# Patient Record
Sex: Female | Born: 1950 | Race: White | Hispanic: No | Marital: Married | Smoking: Never smoker
Health system: Southern US, Community
[De-identification: ages and names within clinical notes are randomized; demographics above are authoritative.]

## PROBLEM LIST (undated history)

## (undated) ENCOUNTER — Ambulatory Visit (HOSPITAL_COMMUNITY): Disposition: A | Discharge status: Home / Self Care | Payer: BLUE CROSS/BLUE SHIELD

## (undated) DIAGNOSIS — R002 Palpitations: Secondary | ICD-10-CM

## (undated) DIAGNOSIS — A6920 Lyme disease, unspecified: Secondary | ICD-10-CM

## (undated) DIAGNOSIS — L659 Nonscarring hair loss, unspecified: Secondary | ICD-10-CM

## (undated) HISTORY — DX: Nonscarring hair loss, unspecified: L65.9

## (undated) HISTORY — DX: Lyme disease, unspecified: A69.20

## (undated) HISTORY — DX: Palpitations: R00.2

---
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
## 2003-11-22 ENCOUNTER — Emergency Department (HOSPITAL_COMMUNITY): Admission: EM | Admit: 2003-11-22 | Discharge: 2003-11-22 | Payer: Self-pay | Admitting: Emergency Medicine

## 2009-01-06 ENCOUNTER — Ambulatory Visit: Payer: Self-pay | Admitting: Gastroenterology

## 2009-01-06 DIAGNOSIS — K3189 Other diseases of stomach and duodenum: Secondary | ICD-10-CM | POA: Insufficient documentation

## 2009-01-06 DIAGNOSIS — R1013 Epigastric pain: Secondary | ICD-10-CM

## 2011-07-25 ENCOUNTER — Emergency Department (HOSPITAL_COMMUNITY)
Admission: EM | Admit: 2011-07-25 | Discharge: 2011-07-25 | Disposition: A | Discharge status: Home / Self Care | Payer: Self-pay | Attending: Emergency Medicine | Admitting: Emergency Medicine

## 2011-07-25 ENCOUNTER — Encounter (HOSPITAL_COMMUNITY): Payer: Self-pay | Admitting: Emergency Medicine

## 2011-07-25 DIAGNOSIS — R079 Chest pain, unspecified: Secondary | ICD-10-CM | POA: Insufficient documentation

## 2011-07-25 DIAGNOSIS — Z7982 Long term (current) use of aspirin: Secondary | ICD-10-CM | POA: Insufficient documentation

## 2011-07-25 LAB — CBC
HCT: 33.8 % — ABNORMAL LOW (ref 36.0–46.0)
Hemoglobin: 11.9 g/dL — ABNORMAL LOW (ref 12.0–15.0)
MCH: 30.8 pg (ref 26.0–34.0)
MCHC: 35.2 g/dL (ref 30.0–36.0)
MCV: 87.6 fL (ref 78.0–100.0)
Platelets: 185 10*3/uL (ref 150–400)
RBC: 3.86 MIL/uL — ABNORMAL LOW (ref 3.87–5.11)
RDW: 11.6 % (ref 11.5–15.5)
WBC: 4.1 10*3/uL (ref 4.0–10.5)

## 2011-07-25 LAB — DIFFERENTIAL
Basophils Absolute: 0.1 10*3/uL (ref 0.0–0.1)
Basophils Relative: 1 % (ref 0–1)
Eosinophils Absolute: 0.1 10*3/uL (ref 0.0–0.7)
Eosinophils Relative: 2 % (ref 0–5)
Lymphocytes Relative: 23 % (ref 12–46)
Lymphs Abs: 1 10*3/uL (ref 0.7–4.0)
Monocytes Absolute: 0.4 10*3/uL (ref 0.1–1.0)
Monocytes Relative: 10 % (ref 3–12)
Neutro Abs: 2.6 10*3/uL (ref 1.7–7.7)
Neutrophils Relative %: 64 % (ref 43–77)

## 2011-07-25 LAB — COMPREHENSIVE METABOLIC PANEL
ALT: 14 U/L (ref 0–35)
AST: 17 U/L (ref 0–37)
Albumin: 3.8 g/dL (ref 3.5–5.2)
Alkaline Phosphatase: 55 U/L (ref 39–117)
BUN: 12 mg/dL (ref 6–23)
CO2: 25 mEq/L (ref 19–32)
Calcium: 9.1 mg/dL (ref 8.4–10.5)
Chloride: 103 mEq/L (ref 96–112)
Creatinine, Ser: 0.69 mg/dL (ref 0.50–1.10)
GFR calc Af Amer: >90 mL/min (ref >90)
GFR calc non Af Amer: >90 mL/min (ref >90)
Glucose, Bld: 95 mg/dL (ref 70–99)
Potassium: 4.1 mEq/L (ref 3.5–5.1)
Sodium: 136 mEq/L (ref 135–145)
Total Bilirubin: 0.6 mg/dL (ref 0.3–1.2)
Total Protein: 6.5 g/dL (ref 6.0–8.3)

## 2011-07-25 LAB — TROPONIN I: Troponin I: <0.3 ng/mL (ref <0.30)

## 2011-07-25 NOTE — ED Provider Notes (Signed)
History     CSN: [id number]  Arrival date & time 07/25/11  8413   First MD Initiated Contact with Patient 07/25/11 [phone]      Chief Complaint  Patient presents with  . Chest Pain    (Consider location/radiation/quality/duration/timing/severity/associated sxs/prior treatment) Patient is a 61 y.o. female presenting with chest pain. The history is provided by the patient (pt complains of chest pain today with pain in left arm.  pt states she is under alot of stress). No language interpreter was used.  Chest Pain The chest pain began 6 - 12 hours ago. Chest pain occurs frequently. The chest pain is unchanged. The pain is associated with stress. At its most intense, the pain is at 5/10. The pain is currently at 0/10. The severity of the pain is moderate. The quality of the pain is described as aching. The pain radiates to the left arm. Chest pain is worsened by stress. Pertinent negatives for primary symptoms include no fever, no fatigue, no cough and no abdominal pain.  Pertinent negatives for associated symptoms include no claudication.  Pertinent negatives for past medical history include no seizures.     History reviewed. No pertinent past medical history.  History reviewed. No pertinent past surgical history.  History reviewed. No pertinent family history.  History  Substance Use Topics  . Smoking status: Never Smoker   . Smokeless tobacco: Not on file  . Alcohol Use: No    OB History    Grav Para Term Preterm Abortions TAB SAB Ect Mult Living                  Review of Systems  Constitutional: Negative for fever and fatigue.  HENT: Negative for congestion, sinus pressure and ear discharge.   Eyes: Negative for discharge.  Respiratory: Negative for cough.   Cardiovascular: Positive for chest pain. Negative for claudication.  Gastrointestinal: Negative for abdominal pain and diarrhea.  Genitourinary: Negative for frequency and hematuria.  Musculoskeletal: Negative for  back pain.  Skin: Negative for rash.  Neurological: Negative for seizures and headaches.  Hematological: Negative.   Psychiatric/Behavioral: Negative for hallucinations.    Allergies  Review of patient's allergies indicates no known allergies.  Home Medications   Current Outpatient Rx  Name Route Sig Dispense Refill  . ASPIRIN 325 MG PO TABS Oral Take 325 mg by mouth daily.      BP 132/58  Pulse 76  Temp(Src) 98.4 F (36.9 C) (Oral)  Resp 16  SpO2 100%  Physical Exam  Constitutional: She is oriented to person, place, and time. She appears well-developed.  HENT:  Head: Normocephalic and atraumatic.  Eyes: Conjunctivae and EOM are normal. No scleral icterus.  Neck: Neck supple. No thyromegaly present.  Cardiovascular: Normal rate and regular rhythm.  Exam reveals no gallop and no friction rub.   No murmur heard. Pulmonary/Chest: No stridor. She has no wheezes. She has no rales. She exhibits no tenderness.  Abdominal: She exhibits no distension. There is no tenderness. There is no rebound.  Musculoskeletal: Normal range of motion. She exhibits no edema.  Lymphadenopathy:    She has no cervical adenopathy.  Neurological: She is oriented to person, place, and time. Coordination normal.  Skin: No rash noted. No erythema.  Psychiatric: She has a normal mood and affect. Her behavior is normal.    ED Course  Procedures (including critical care time)  Labs Reviewed  CBC - Abnormal; Notable for the following:    RBC 3.86 (*)  Hemoglobin 11.9 (*)    HCT 33.8 (*)    All other components within normal limits  DIFFERENTIAL  COMPREHENSIVE METABOLIC PANEL  TROPONIN I   No results found.   1. Chest pain      Date: 07/25/2011  Rate:67  Rhythm: normal sinus rhythm  QRS Axis: normal  Intervals: normal  ST/T Wave abnormalities: normal  Conduction Disutrbances:none  Narrative Interpretation:   Old EKG Reviewed: none available    MDM  Stress and anxiety causing  chest pain        Benny Lennert, MD 07/25/11 1108

## 2011-07-25 NOTE — ED Notes (Signed)
Yesterday--states had an episode of confusion at about 6pm, better now, this morning started having indigestion with jaw pain with left arm pain, states hands feel numb, drove self to ED

## 2011-07-25 NOTE — Discharge Instructions (Signed)
Follow up with a family md in 1-2 weeks °

## 2013-05-16 ENCOUNTER — Emergency Department (HOSPITAL_COMMUNITY)
Admission: EM | Admit: 2013-05-16 | Discharge: 2013-05-17 | Disposition: A | Discharge status: Home / Self Care | Payer: No Typology Code available for payment source | Attending: Emergency Medicine | Admitting: Emergency Medicine

## 2013-05-16 ENCOUNTER — Encounter (HOSPITAL_COMMUNITY): Payer: Self-pay | Admitting: Emergency Medicine

## 2013-05-16 ENCOUNTER — Emergency Department (HOSPITAL_COMMUNITY): Payer: No Typology Code available for payment source

## 2013-05-16 DIAGNOSIS — I209 Angina pectoris, unspecified: Secondary | ICD-10-CM | POA: Insufficient documentation

## 2013-05-16 DIAGNOSIS — I208 Other forms of angina pectoris: Secondary | ICD-10-CM

## 2013-05-16 DIAGNOSIS — Z7982 Long term (current) use of aspirin: Secondary | ICD-10-CM | POA: Insufficient documentation

## 2013-05-16 DIAGNOSIS — Z79899 Other long term (current) drug therapy: Secondary | ICD-10-CM | POA: Insufficient documentation

## 2013-05-16 DIAGNOSIS — R0602 Shortness of breath: Secondary | ICD-10-CM | POA: Insufficient documentation

## 2013-05-16 LAB — CBC
HCT: 35.2 % — ABNORMAL LOW (ref 36.0–46.0)
Hemoglobin: 12 g/dL (ref 12.0–15.0)
MCH: 30.8 pg (ref 26.0–34.0)
MCHC: 34.1 g/dL (ref 30.0–36.0)
MCV: 90.5 fL (ref 78.0–100.0)
Platelets: 182 10*3/uL (ref 150–400)
RBC: 3.89 MIL/uL (ref 3.87–5.11)
RDW: 12 % (ref 11.5–15.5)
WBC: 5.4 10*3/uL (ref 4.0–10.5)

## 2013-05-16 LAB — I-STAT TROPONIN, ED: Troponin i, poc: 0 ng/mL (ref 0.00–0.08)

## 2013-05-16 LAB — BASIC METABOLIC PANEL
BUN: 20 mg/dL (ref 6–23)
CO2: 26 mEq/L (ref 19–32)
Calcium: 9.6 mg/dL (ref 8.4–10.5)
Chloride: 99 mEq/L (ref 96–112)
Creatinine, Ser: 0.68 mg/dL (ref 0.50–1.10)
GFR calc Af Amer: >90 mL/min (ref >90)
GFR calc non Af Amer: >90 mL/min (ref >90)
Glucose, Bld: 111 mg/dL — ABNORMAL HIGH (ref 70–99)
Potassium: 4.3 mEq/L (ref 3.7–5.3)
Sodium: 137 mEq/L (ref 137–147)

## 2013-05-16 NOTE — ED Notes (Signed)
EKG given to EDP, Plunkett, MD. For review. 

## 2013-05-16 NOTE — ED Notes (Signed)
Pt reports L sided CP after walking, pt reports there was small hill that may be more than she could handle, pt states that she has been having intermittent CP at night for the past week with ShOB, dizziness and nausea, denies these symptoms at this time. Pt a&o x4 at this time

## 2013-05-16 NOTE — ED Notes (Signed)
MD at bedside. 

## 2013-05-17 LAB — TROPONIN I: Troponin I: <0.3 ng/mL (ref <0.30)

## 2013-05-17 MED ORDER — ASPIRIN EC 325 MG PO TBEC: 325.0000 mg | DELAYED_RELEASE_TABLET | Freq: Every day | ORAL | Status: DC

## 2013-05-17 NOTE — ED Provider Notes (Signed)
CSN: [id number]     Arrival date & time 05/16/13  2155 History   First MD Initiated Contact with Patient 05/16/13 2304     Chief Complaint  Patient presents with  . Chest Pain     (Consider location/radiation/quality/duration/timing/severity/associated sxs/prior Treatment) Patient is a 63 y.o. female presenting with chest pain. The history is provided by the patient.  Chest Pain Pain location:  L chest Pain quality: pressure and tightness   Pain radiates to:  L arm Pain radiates to the back: no   Pain severity:  Moderate Onset quality:  Sudden Duration:  15 minutes Timing:  Rare Chronicity:  New Context: movement and stress   Relieved by:  Rest Associated symptoms: shortness of breath   Associated symptoms: no diaphoresis     History reviewed. No pertinent past medical history. History reviewed. No pertinent past surgical history. History reviewed. No pertinent family history. History  Substance Use Topics  . Smoking status: Never Smoker   . Smokeless tobacco: Never Used  . Alcohol Use: No     Comment: quit at age 63   OB History   Grav Para Term Preterm Abortions TAB SAB Ect Mult Living                 Review of Systems  Constitutional: Negative for diaphoresis.  Respiratory: Positive for shortness of breath.   Cardiovascular: Positive for chest pain.  All other systems reviewed and are negative.      Allergies  Review of patient's allergies indicates no known allergies.  Home Medications   Current Outpatient Rx  Name  Route  Sig  Dispense  Refill  . Ascorbic Acid (VITAMIN C) 100 MG tablet   Oral   Take 100 mg by mouth daily.         Marland Kitchen. estradiol (CLIMARA - DOSED IN MG/24 HR) 0.0375 mg/24hr patch   Transdermal   Place 0.0375 mg onto the skin once a week.         . Nutritional Supplements (DHEA PO)   Oral   Take 1 capsule by mouth daily.         . progesterone (PROMETRIUM) 100 MG capsule   Oral   Take 100 mg by mouth daily.         Marland Kitchen.  thyroid (ARMOUR) 65 MG tablet   Oral   Take 65 mg by mouth daily.         Marland Kitchen. aspirin 325 MG tablet   Oral   Take 325 mg by mouth every 6 (six) hours as needed for headache.          Marland Kitchen. aspirin EC 325 MG tablet   Oral   Take 1 tablet (325 mg total) by mouth daily.   30 tablet   0   . aspirin EC 325 MG tablet   Oral   Take 1 tablet (325 mg total) by mouth daily.   30 tablet   0    BP 119/63  Pulse 58  Temp(Src) 98.5 F (36.9 C) (Oral)  Resp 20  SpO2 99% Physical Exam  Nursing note and vitals reviewed. Constitutional: She is oriented to person, place, and time. She appears well-developed and well-nourished.  HENT:  Head: Normocephalic and atraumatic.  Eyes: EOM are normal. Pupils are equal, round, and reactive to light.  Neck: Neck supple.  Cardiovascular: Normal rate, regular rhythm and normal heart sounds.   No murmur heard. Pulmonary/Chest: Effort normal. No respiratory distress.  Abdominal: Soft. She exhibits no  distension. There is no tenderness. There is no rebound and no guarding.  Musculoskeletal: She exhibits no edema and no tenderness.  Neurological: She is alert and oriented to person, place, and time.  Skin: Skin is warm and dry.    ED Course  Procedures (including critical care time) Labs Review Labs Reviewed  CBC - Abnormal; Notable for the following:    HCT 35.2 (*)    All other components within normal limits  BASIC METABOLIC PANEL - Abnormal; Notable for the following:    Glucose, Bld 111 (*)    All other components within normal limits  TROPONIN I  Rosezena Sensor, ED   Imaging Review Dg Chest 2 View  05/16/2013   CLINICAL DATA:  Chest pain  EXAM: CHEST  2 VIEW  COMPARISON:  None.  FINDINGS: Mild hyperexpansion. Right middle lobe 8 mm nodule is favored to be calcified, most in keeping with sequelae of prior granulomatous disease. Mild aortic arch tortuosity. Cardiomediastinal contours otherwise within normal range. No pleural effusion or  pneumothorax. No acute osseous finding.  IMPRESSION: Mild hyperexpansion.  No focal consolidation.  8 mm right middle lobe nodule, favored to be calcified/sequelae of prior granulomatous disease   Electronically Signed   By: Jearld Lesch M.D.   On: 05/16/2013 23:31     EKG Interpretation   Date/Time:  Thursday May 16 2013 22:05:55 EDT Ventricular Rate:  62 PR Interval:  131 QRS Duration: 90 QT Interval:  413 QTC Calculation: 419 R Axis:   83 Text Interpretation:  Sinus rhythm Borderline right axis deviation  Probable anteroseptal infarct, old Confirmed by Rhunette Croft, MD, Janey Genta  [phone]) on 05/16/2013 11:29:28 PM      MDM   Final diagnoses:  Stable angina    63 y/o comes in with cc of chest pain. Chest pain is new. 1st episode 2 days ago, described as tightness, with left arm numbness that woke her up from sleep. Chest pain today was whilst she was walking over a hill, and it resolved with rest.  Differential diagnosis includes: ACS syndrome Myocarditis Pericarditis Pericardial effusion Pneumonia Pleural effusion Pulmonary edema PE Dissection Musculoskeletal pain  Pt has no cardiac risk factors and her HEART score is 3. Concerns for stable angina. We advocated, given it is Friday, to admit patient to the hospital - however, she would prefer outpatient workup. We have asked her to come to the ER if the sx get worse, or more frequent.   Derwood Kaplan, MD 05/17/13 [phone]

## 2013-05-17 NOTE — Discharge Instructions (Signed)
We saw you in the ER for the chest pain/shortness of breath. All of our cardiac workup is normal, including labs, EKG and chest X-RAY are normal. THAT BEING SAID, we are concerned about the type of chest pain and want you to see your doctor on Monday - and return to the ER earler if the symptoms get worse. Cardiology contact info is provided as well.  Angina Pectoris Angina pectoris, often just called angina, is extreme discomfort in your chest, neck, or arm caused by a lack of blood in the middle and thickest layer of your heart wall (myocardium). It may feel like tightness or heavy pressure. It may feel like a crushing or squeezing pain. Some people say it feels like gas or indigestion. It may go down your shoulders, back, and arms. Some people may have symptoms other than pain. These symptoms include fatigue, shortness of breath, cold sweats, or nausea. There are four different types of angina:  Stable angina Stable angina usually occurs in episodes of predictable frequency and duration. It usually is brought on by physical activity, emotional stress, or excitement. These are all times when the myocardium needs more oxygen. Stable angina usually lasts a few minutes and often is relieved by taking a medicine that can be taken under your tongue (sublingually). The medicine is called nitroglycerin. Stable angina is caused by a buildup of plaque inside the arteries, which restricts blood flow to the heart muscle (atherosclerosis).  Unstable angina Unstable angina can occur even when your body experiences little or no physical exertion. It can occur during sleep. It can also occur at rest. It can suddenly increase in severity or frequency. It might not be relieved by sublingual nitroglycerin. It can last up to 30 minutes. The most common cause of unstable angina is a blood clot that has developed on the top of plaque buildup inside a coronary artery. It can lead to a heart attack if the blood clot completely  blocks the artery.  Microvascular angina This type of angina is caused by a disorder of tiny blood vessels called arterioles. Microvascular angina is more common in women. The pain may be more severe and last longer than other types of angina pectoris.  Prinzmetal or variant angina This type of angina pectoris usually occurs when your body experiences little or no physical exertion. It especially occurs in the early morning hours. It is caused by a spasm of your coronary artery. HOME CARE INSTRUCTIONS   Only take over-the-counter and prescription medicines as directed by your caregiver.  Stay active or increase your exercise as directed by your caregiver.  Limit strenuous activity as directed by your caregiver.  Limit heavy lifting as directed by your caregiver.  Maintain a healthy weight.  Learn about and eat heart-healthy foods.  Do not smoke. SEEK IMMEDIATE MEDICAL CARE IF:  You experience the following symptoms:  Chest, neck, deep shoulder, or arm pain or discomfort that lasts more than a few minutes.  Chest, neck, deep shoulder, or arm pain or discomfort that goes away and comes back, repeatedly.  Heavy sweating with discomfort, without a noticeable cause.  Shortness of breath or difficulty breathing.  Angina that does not get better after a few minutes of rest or after taking sublingual nitroglycerin. These can all be symptoms of a heart attack, which is a medical emergency! Get medical help at once. Call your local emergency service (911 in [place] immediately. Do not  drive yourself to the hospital and do not  wait  to for your symptoms to go away. MAKE SURE YOU:  Understand these instructions.  Will watch your condition.  Will get help right away if you are not doing well or get worse. Document Released: 02/21/2005 Document Revised: 02/08/2012 Document Reviewed: 12/01/2011 [facility] Patient Information 2014 [place], [place].

## 2013-05-22 ENCOUNTER — Other Ambulatory Visit: Payer: Self-pay

## 2013-05-29 ENCOUNTER — Other Ambulatory Visit: Payer: Self-pay | Admitting: Family Medicine

## 2013-05-29 DIAGNOSIS — N6320 Unspecified lump in the left breast, unspecified quadrant: Secondary | ICD-10-CM

## 2013-06-05 ENCOUNTER — Other Ambulatory Visit: Payer: No Typology Code available for payment source

## 2013-06-13 ENCOUNTER — Other Ambulatory Visit: Payer: No Typology Code available for payment source

## 2014-01-28 ENCOUNTER — Other Ambulatory Visit (HOSPITAL_COMMUNITY): Payer: Self-pay | Admitting: Cardiology

## 2014-01-28 DIAGNOSIS — R079 Chest pain, unspecified: Secondary | ICD-10-CM

## 2014-02-03 ENCOUNTER — Emergency Department (HOSPITAL_COMMUNITY): Payer: BC Managed Care – PPO

## 2014-02-03 ENCOUNTER — Encounter (HOSPITAL_COMMUNITY): Payer: Self-pay | Admitting: Emergency Medicine

## 2014-02-03 ENCOUNTER — Emergency Department (HOSPITAL_COMMUNITY)
Admission: EM | Admit: 2014-02-03 | Discharge: 2014-02-03 | Disposition: A | Discharge status: Home / Self Care | Payer: BC Managed Care – PPO | Attending: Emergency Medicine | Admitting: Emergency Medicine

## 2014-02-03 DIAGNOSIS — R002 Palpitations: Secondary | ICD-10-CM | POA: Diagnosis not present

## 2014-02-03 DIAGNOSIS — R200 Anesthesia of skin: Secondary | ICD-10-CM | POA: Insufficient documentation

## 2014-02-03 DIAGNOSIS — R0789 Other chest pain: Secondary | ICD-10-CM

## 2014-02-03 DIAGNOSIS — Z79899 Other long term (current) drug therapy: Secondary | ICD-10-CM | POA: Diagnosis not present

## 2014-02-03 DIAGNOSIS — R51 Headache: Secondary | ICD-10-CM | POA: Insufficient documentation

## 2014-02-03 DIAGNOSIS — R110 Nausea: Secondary | ICD-10-CM | POA: Insufficient documentation

## 2014-02-03 DIAGNOSIS — R0602 Shortness of breath: Secondary | ICD-10-CM | POA: Diagnosis not present

## 2014-02-03 DIAGNOSIS — Z7982 Long term (current) use of aspirin: Secondary | ICD-10-CM | POA: Insufficient documentation

## 2014-02-03 LAB — CBC
HCT: 38.8 % (ref 36.0–46.0)
Hemoglobin: 13.5 g/dL (ref 12.0–15.0)
MCH: 30.7 pg (ref 26.0–34.0)
MCHC: 34.8 g/dL (ref 30.0–36.0)
MCV: 88.2 fL (ref 78.0–100.0)
Platelets: 194 10*3/uL (ref 150–400)
RBC: 4.4 MIL/uL (ref 3.87–5.11)
RDW: 11.6 % (ref 11.5–15.5)
WBC: 4.2 10*3/uL (ref 4.0–10.5)

## 2014-02-03 LAB — BASIC METABOLIC PANEL
Anion gap: 13 (ref 5–15)
BUN: 14 mg/dL (ref 6–23)
CO2: 24 mEq/L (ref 19–32)
Calcium: 9.5 mg/dL (ref 8.4–10.5)
Chloride: 104 mEq/L (ref 96–112)
Creatinine, Ser: 0.7 mg/dL (ref 0.50–1.10)
GFR calc Af Amer: >90 mL/min (ref >90)
GFR calc non Af Amer: >90 mL/min (ref >90)
Glucose, Bld: 97 mg/dL (ref 70–99)
Potassium: 4.5 mEq/L (ref 3.7–5.3)
Sodium: 141 mEq/L (ref 137–147)

## 2014-02-03 LAB — I-STAT TROPONIN, ED
Troponin i, poc: 0 ng/mL (ref 0.00–0.08)
Troponin i, poc: 0 ng/mL (ref 0.00–0.08)

## 2014-02-03 MED ORDER — ASPIRIN 81 MG PO CHEW
324.0000 mg | CHEWABLE_TABLET | Freq: Once | ORAL | Status: DC
  Filled 2014-02-03: qty 4

## 2014-02-03 NOTE — ED Notes (Signed)
Pt c/o HA and pain in chest with palpations starting yesterday; pt sts some numbness to left side of face starting yesterday with nausea

## 2014-02-03 NOTE — ED Notes (Signed)
Pt refused ASA because "they are not buffered." Pt's spouse went to vehicle to get patient her own ASA.

## 2014-02-03 NOTE — ED Notes (Signed)
Pt IV removed per request. PA made aware and request RN to do this. Pt to have troponin drawn at 1215 as repeat. Pt made aware of plan of care.

## 2014-02-03 NOTE — ED Provider Notes (Signed)
CSN: [id number]     Arrival date & time 02/03/14  [id number] History   First MD Initiated Contact with Patient 02/03/14 [phone]     Chief Complaint  Patient presents with  . Headache  . Palpitations     (Consider location/radiation/quality/duration/timing/severity/associated sxs/prior Treatment) HPI  Pt is a 63yo female with hx of intermittent chest pains for last several weeks, presenting to ED with c/o palpitations with intermittent substernal chest pain, and left sided facial numbness with nausea that started yesterday.  CP has been constant since onset this morning around 4:30AM.  Pt states chest pain is dull, 4/10 at this time, does not radiate. Chest pain is 8/10 at worst. Nothing seems to make it better or worse.  She has not taken anything for pain PTA.  Pt does report being seen by Dr. Sharyn LullHarwani, cardiology, Tuesday 11/24 for similar complaints, was scheduled for a stress test on Wednesday, 12/2. Pt states she had a stress test last year and all her results were normal at that time. Denies hx of MI. Pt is concerned she has angina but states she has not had a test for that yet. Per medical records, pt was seen in ED for chest pain on 07/25/11 and 05/16/13 for chest pain.  Denies recent illness, no fever, cough, congestion, vomiting or diarrhea. No recent travel or sick contacts.   History reviewed. No pertinent past medical history. History reviewed. No pertinent past surgical history. History reviewed. No pertinent family history. History  Substance Use Topics  . Smoking status: Never Smoker   . Smokeless tobacco: Never Used  . Alcohol Use: No     Comment: quit at age 63   OB History    No data available     Review of Systems  Constitutional: Negative for fever, chills and fatigue.  Respiratory: Positive for shortness of breath. Negative for cough.   Cardiovascular: Positive for chest pain and palpitations ( "heart racing"). Negative for leg swelling.  Gastrointestinal: Positive for  nausea. Negative for vomiting, abdominal pain, diarrhea and constipation.  Neurological: Positive for numbness ( left side of face) and headaches. Negative for dizziness, tremors, seizures, syncope, facial asymmetry, speech difficulty, weakness and light-headedness.  All other systems reviewed and are negative.     Allergies  Review of patient's allergies indicates no known allergies.  Home Medications   Prior to Admission medications   Medication Sig Start Date End Date Taking? Authorizing Provider  aspirin 325 MG tablet Take 325 mg by mouth every 6 (six) hours as needed for headache.    Yes Historical Provider, MD  b complex vitamins tablet Take 1 tablet by mouth 2 (two) times daily.   Yes Historical Provider, MD  Fish Oil OIL Take 5 mLs by mouth daily.   Yes Historical Provider, MD  PRESCRIPTION MEDICATION Apply 1 application topically 2 (two) times daily. Estrogen cream. Pharmacy listed says the did not fill this   Yes Historical Provider, MD  thyroid (ARMOUR) 65 MG tablet Take 65 mg by mouth daily.   Yes Historical Provider, MD  aspirin EC 325 MG tablet Take 1 tablet (325 mg total) by mouth daily. Patient not taking: Reported on 02/03/2014 05/17/13   Derwood KaplanAnkit Nanavati, MD  aspirin EC 325 MG tablet Take 1 tablet (325 mg total) by mouth daily. Patient not taking: Reported on 02/03/2014 05/17/13   Derwood KaplanAnkit Nanavati, MD  progesterone (PROMETRIUM) 100 MG capsule Take 200 mg by mouth at bedtime.     Historical Provider, MD  BP 98/45 mmHg  Pulse 55  Temp(Src) 98.1 F (36.7 C) (Oral)  Resp 18  SpO2 98% Physical Exam  Constitutional: She appears well-developed and well-nourished. No distress.  Pt lying comfortably in exam bed, NAD.   HENT:  Head: Normocephalic and atraumatic.  Eyes: Conjunctivae are normal. No scleral icterus.  Neck: Normal range of motion.  Cardiovascular: Normal rate, regular rhythm and normal heart sounds.   Regular rate and rhythm  Pulmonary/Chest: Effort normal and  breath sounds normal. No respiratory distress. She has no wheezes. She has no rales. She exhibits no tenderness.  No respiratory distress, able to speak in full sentences w/o difficulty. Lungs: CTAB  Abdominal: Soft. Bowel sounds are normal. She exhibits no distension and no mass. There is no tenderness. There is no rebound and no guarding.  Musculoskeletal: Normal range of motion.  Neurological: She is alert.  Skin: Skin is warm and dry. She is not diaphoretic.  Nursing note and vitals reviewed.   ED Course  Procedures (including critical care time) Labs Review Labs Reviewed  BASIC METABOLIC PANEL  CBC  I-STAT TROPOININ, ED  I-STAT TROPOININ, ED  Rosezena SensorI-STAT TROPOININ, ED    Imaging Review Dg Chest 2 View  02/03/2014   CLINICAL DATA:  Cardiac palpitations  EXAM: CHEST  2 VIEW  COMPARISON:  May 16, 2013  FINDINGS: There is a stable 8 x 7 mm nodular lesion in the right middle lobe. Elsewhere lungs are clear. Heart size and pulmonary vascularity are normal. No adenopathy. No bone lesions. No pneumothorax.  IMPRESSION: Probable granuloma right middle lobe, stable. Lungs elsewhere are clear. No change in cardiac silhouette.   Electronically Signed   By: Bretta BangWilliam  Woodruff M.D.   On: 02/03/2014 09:51     EKG Interpretation   Date/Time:  Monday February 03 2014 08:46:19 EST Ventricular Rate:  88 PR Interval:  132 QRS Duration: 94 QT Interval:  370 QTC Calculation: 447 R Axis:   91 Text Interpretation:  Suspect arm lead reversal, interpretation  assumes no reversal Normal sinus rhythm \\E \ since EKG 25 Jul 2011 Right  atrial enlargement Rightward axis Pulmonary disease pattern Confirmed by  KNAPP  MD-I, IVA ([id number]) on 02/03/2014 9:01:25 AM      MDM   Final diagnoses:  Other chest pain  Palpitations     Pt is a 63yo female with hx of intermittent chest pain c/o constant chest pain since onset at 4:30AM this morning.  Pt has had normal stress test last year. No previous ACS.  Pt  concerned for angina and is scheduled for stress test on Wednesday, 02/05/14.  Pt appears well, non-toxic, NAD. Cardiopulmonary exam: RRR, lungs: CTAB, no respiratory distress. Cardiac workup performed in ED. Doubt PE or pneumonia.    10:03 AM Consulted with Dr. Sharyn LullHarwani, cardiology, advised pt may be discharged home to f/u as scheduled for Wednesday's stress test if delta-troponin negative for elevation.   Delta troponin: negative. Will discharge pt home to f/u as scheduled for Wednesday's stress test.  Return precautions provided. Pt verbalized understanding and agreement with tx plan.    Junius FinnerErin O'Malley, PA-C 02/03/14 1613  Ward GivensIva L Knapp, MD 02/03/14 [phone]

## 2014-02-03 NOTE — ED Notes (Signed)
Pt reports she can't take aspirin without food. PA reports she can eat. Pt reports she has a special diet , therefore husband is going to car to get her special cereal.

## 2014-02-05 ENCOUNTER — Encounter (HOSPITAL_COMMUNITY): Payer: BC Managed Care – PPO

## 2014-02-11 ENCOUNTER — Encounter (HOSPITAL_COMMUNITY)
Admission: RE | Admit: 2014-02-11 | Discharge: 2014-02-11 | Disposition: A | Discharge status: Home / Self Care | Payer: BC Managed Care – PPO | Source: Ambulatory Visit | Attending: Cardiology | Admitting: Cardiology

## 2014-02-11 ENCOUNTER — Other Ambulatory Visit: Payer: Self-pay

## 2014-02-11 VITALS — BP 121/64

## 2014-02-11 DIAGNOSIS — R079 Chest pain, unspecified: Secondary | ICD-10-CM | POA: Diagnosis present

## 2014-02-11 MED ORDER — TECHNETIUM TC 99M SESTAMIBI GENERIC - CARDIOLITE
10.0000 | Freq: Once | INTRAVENOUS | Status: AC | PRN
  Administered 2014-02-11: 10 via INTRAVENOUS

## 2014-02-11 MED ORDER — TECHNETIUM TC 99M SESTAMIBI GENERIC - CARDIOLITE
30.0000 | Freq: Once | INTRAVENOUS | Status: AC | PRN
  Administered 2014-02-11: 30 via INTRAVENOUS

## 2014-06-07 ENCOUNTER — Emergency Department (HOSPITAL_COMMUNITY): Payer: 59

## 2014-06-07 ENCOUNTER — Encounter (HOSPITAL_COMMUNITY): Payer: Self-pay | Admitting: Family Medicine

## 2014-06-07 ENCOUNTER — Emergency Department (HOSPITAL_COMMUNITY)
Admission: EM | Admit: 2014-06-07 | Discharge: 2014-06-07 | Disposition: A | Discharge status: Home / Self Care | Payer: 59 | Attending: Emergency Medicine | Admitting: Emergency Medicine

## 2014-06-07 DIAGNOSIS — M79674 Pain in right toe(s): Secondary | ICD-10-CM | POA: Diagnosis not present

## 2014-06-07 DIAGNOSIS — R0602 Shortness of breath: Secondary | ICD-10-CM | POA: Insufficient documentation

## 2014-06-07 DIAGNOSIS — R079 Chest pain, unspecified: Secondary | ICD-10-CM | POA: Diagnosis not present

## 2014-06-07 DIAGNOSIS — Z7982 Long term (current) use of aspirin: Secondary | ICD-10-CM | POA: Diagnosis not present

## 2014-06-07 DIAGNOSIS — G8929 Other chronic pain: Secondary | ICD-10-CM | POA: Insufficient documentation

## 2014-06-07 DIAGNOSIS — Z79899 Other long term (current) drug therapy: Secondary | ICD-10-CM | POA: Insufficient documentation

## 2014-06-07 LAB — URINALYSIS, ROUTINE W REFLEX MICROSCOPIC
Bilirubin Urine: NEGATIVE
Glucose, UA: NEGATIVE mg/dL
Ketones, ur: NEGATIVE mg/dL
Leukocytes, UA: NEGATIVE
Nitrite: NEGATIVE
Protein, ur: NEGATIVE mg/dL
Specific Gravity, Urine: 1.009 (ref 1.005–1.030)
Urobilinogen, UA: 0.2 mg/dL (ref 0.0–1.0)
pH: 6 (ref 5.0–8.0)

## 2014-06-07 LAB — BASIC METABOLIC PANEL
Anion gap: 6 (ref 5–15)
BUN: 11 mg/dL (ref 6–23)
CO2: 27 mmol/L (ref 19–32)
Calcium: 9 mg/dL (ref 8.4–10.5)
Chloride: 106 mmol/L (ref 96–112)
Creatinine, Ser: 0.76 mg/dL (ref 0.50–1.10)
GFR calc non Af Amer: 89 mL/min — ABNORMAL LOW (ref >90)
Glucose, Bld: 97 mg/dL (ref 70–99)
POTASSIUM: 4.2 mmol/L (ref 3.5–5.1)
Sodium: 139 mmol/L (ref 135–145)

## 2014-06-07 LAB — CBC
HCT: 35.9 % — ABNORMAL LOW (ref 36.0–46.0)
Hemoglobin: 12.5 g/dL (ref 12.0–15.0)
MCH: 30.9 pg (ref 26.0–34.0)
MCHC: 34.8 g/dL (ref 30.0–36.0)
MCV: 88.9 fL (ref 78.0–100.0)
Platelets: 171 10*3/uL (ref 150–400)
RBC: 4.04 MIL/uL (ref 3.87–5.11)
RDW: 11.7 % (ref 11.5–15.5)
WBC: 3.8 10*3/uL — ABNORMAL LOW (ref 4.0–10.5)

## 2014-06-07 LAB — URINE MICROSCOPIC-ADD ON

## 2014-06-07 LAB — I-STAT TROPONIN, ED: Troponin i, poc: 0 ng/mL (ref 0.00–0.08)

## 2014-06-07 LAB — MAGNESIUM: MAGNESIUM: 2 mg/dL (ref 1.5–2.5)

## 2014-06-07 MED ORDER — GI COCKTAIL ~~LOC~~
30.0000 mL | Freq: Once | ORAL | Status: AC
  Administered 2014-06-07: 30 mL via ORAL
  Filled 2014-06-07: qty 30

## 2014-06-07 NOTE — ED Provider Notes (Signed)
CSN: [id number]     Arrival date & time 06/07/14  0803 History   First MD Initiated Contact with Patient 06/07/14 [id number]     Chief Complaint  Patient presents with  . Chest Pain  . Numbness  . Fatigue     (Consider location/radiation/quality/duration/timing/severity/associated sxs/prior Treatment) Patient is a 64 y.o. female presenting with chest pain.  Chest Pain Pain location:  Substernal area Pain quality: tightness   Pain radiates to:  Does not radiate Pain radiates to the back: no   Pain severity:  Moderate Onset quality:  Gradual Timing:  Constant Chronicity:  Chronic Context comment:  Spontaneous Relieved by:  Nothing Worsened by:  Nothing tried Ineffective treatments:  None tried Associated symptoms: shortness of breath   Associated symptoms: no nausea and not vomiting   Associated symptoms comment:  R 2nd toe pain   History reviewed. No pertinent past medical history. History reviewed. No pertinent past surgical history. History reviewed. No pertinent family history. History  Substance Use Topics  . Smoking status: Never Smoker   . Smokeless tobacco: Never Used  . Alcohol Use: No     Comment: quit at age 64   OB History    No data available     Review of Systems  Respiratory: Positive for shortness of breath.   Cardiovascular: Positive for chest pain.  Gastrointestinal: Negative for nausea and vomiting.  All other systems reviewed and are negative.     Allergies  Review of patient's allergies indicates no known allergies.  Home Medications   Prior to Admission medications   Medication Sig Start Date End Date Taking? Authorizing Provider  aspirin 325 MG tablet Take 325 mg by mouth every 6 (six) hours as needed for headache.    Yes Historical Provider, MD  b complex vitamins tablet Take 1 tablet by mouth 2 (two) times daily.   Yes Historical Provider, MD  Fish Oil OIL Take 5 mLs by mouth daily.   Yes Historical Provider, MD  PRESCRIPTION MEDICATION  Apply 1 application topically 2 (two) times daily. Estrogen cream. Pharmacy listed says the did not fill this   Yes Historical Provider, MD  thyroid (ARMOUR) 65 MG tablet Take 65 mg by mouth daily.   Yes Historical Provider, MD  aspirin EC 325 MG tablet Take 1 tablet (325 mg total) by mouth daily. Patient not taking: Reported on 02/03/2014 05/17/13   Derwood KaplanAnkit Nanavati, MD  aspirin EC 325 MG tablet Take 1 tablet (325 mg total) by mouth daily. Patient not taking: Reported on 02/03/2014 05/17/13   Derwood KaplanAnkit Nanavati, MD  progesterone (PROMETRIUM) 100 MG capsule Take 200 mg by mouth at bedtime.     Historical Provider, MD   BP 109/53 mmHg  Pulse 52  Temp(Src) 98.3 F (36.8 C)  Resp 16  SpO2 97% Physical Exam  Constitutional: She is oriented to person, place, and time. She appears well-developed and well-nourished.  HENT:  Head: Normocephalic and atraumatic.  Right Ear: External ear normal.  Left Ear: External ear normal.  Eyes: Conjunctivae and EOM are normal. Pupils are equal, round, and reactive to light.  Neck: Normal range of motion. Neck supple.  Cardiovascular: Normal rate, regular rhythm, normal heart sounds and intact distal pulses.   Pulmonary/Chest: Effort normal and breath sounds normal.  Abdominal: Soft. Bowel sounds are normal. There is no tenderness.  Musculoskeletal: Normal range of motion.  Neurological: She is alert and oriented to person, place, and time. She has normal strength and normal reflexes. No cranial  nerve deficit or sensory deficit. Coordination normal. GCS eye subscore is 4. GCS verbal subscore is 5. GCS motor subscore is 6.  Skin: Skin is warm and dry.  Vitals reviewed.   ED Course  Procedures (including critical care time) Labs Review Labs Reviewed  BASIC METABOLIC PANEL - Abnormal; Notable for the following:    GFR calc non Af Amer 89 (*)    All other components within normal limits  CBC - Abnormal; Notable for the following:    WBC 3.8 (*)    HCT 35.9 (*)     All other components within normal limits  URINALYSIS, ROUTINE W REFLEX MICROSCOPIC - Abnormal; Notable for the following:    Hgb urine dipstick MODERATE (*)    All other components within normal limits  URINE MICROSCOPIC-ADD ON - Abnormal; Notable for the following:    Squamous Epithelial / LPF FEW (*)    Bacteria, UA FEW (*)    All other components within normal limits  MAGNESIUM  I-STAT TROPOININ, ED    Imaging Review Dg Chest 2 View  06/07/2014   CLINICAL DATA:  Left-sided chest pain.  EXAM: CHEST  2 VIEW  COMPARISON:  02/03/2014  FINDINGS: Stable hyperinflation. Stable nodular density in the right lower chest probably represents a calcified granuloma. Heart and mediastinum are within normal limits. The trachea is midline. No acute bone abnormality. Negative for pleural effusions.  IMPRESSION: No active cardiopulmonary disease.   Electronically Signed   By: Richarda Overlie M.D.   On: 06/07/2014 12:42     EKG Interpretation   Date/Time:  Saturday June 07 2014 08:11:57 EDT Ventricular Rate:  66 PR Interval:  128 QRS Duration: 84 QT Interval:  394 QTC Calculation: 413 R Axis:   89 Text Interpretation:  Normal sinus rhythm Normal ECG No significant change  since last tracing Confirmed by Mirian Mo [phone]) on 06/07/2014  8:20:27 AM      MDM   Final diagnoses:  Chest pain, unspecified chest pain type  Pain of toe of right foot    64 y.o. female with pertinent PMH of chronic chest pain with prior negative wu, followed by harwani presents with acute on chronic symptoms this am, with tingling in the l arm.  On arrival pt has vitals and physical exam as above.  Benign examination.  Pt with minimal symptoms on my exam.  No focal neuro deficits.  No new symptoms in 6 hours.  Wu unremarkable.  Likely recurrent acute on chronic chest pain.  Doubt PE without dyspnea, normal hr.  Similarly doubt PTX, ACS, PNA, or other emergent chest pathology.  Discussed strict return precautions.  DC  home in stable condition to fu with cardiology.    I have reviewed all laboratory and imaging studies if ordered as above  1. Chest pain, unspecified chest pain type   2. Pain of toe of right foot         Mirian Mo, MD 06/07/14 [phone]

## 2014-06-07 NOTE — ED Notes (Addendum)
Pt here for CP, SOB, nausea and extremity numbness over the past week or longer and getting worse. sts she doesn't feel right. sts her left face, arm is numb. sts some lightheaded and dizzy.

## 2014-06-07 NOTE — Discharge Instructions (Signed)

## 2015-05-22 ENCOUNTER — Ambulatory Visit: Payer: Self-pay | Admitting: Cardiology

## 2015-06-08 ENCOUNTER — Ambulatory Visit: Payer: Self-pay | Admitting: Cardiovascular Disease

## 2015-07-25 ENCOUNTER — Encounter (HOSPITAL_COMMUNITY): Payer: Self-pay

## 2015-07-25 ENCOUNTER — Emergency Department (HOSPITAL_COMMUNITY)
Admission: EM | Admit: 2015-07-25 | Discharge: 2015-07-25 | Disposition: A | Discharge status: Home / Self Care | Payer: BLUE CROSS/BLUE SHIELD | Attending: Emergency Medicine | Admitting: Emergency Medicine

## 2015-07-25 ENCOUNTER — Emergency Department (HOSPITAL_COMMUNITY): Payer: BLUE CROSS/BLUE SHIELD

## 2015-07-25 DIAGNOSIS — Z872 Personal history of diseases of the skin and subcutaneous tissue: Secondary | ICD-10-CM | POA: Insufficient documentation

## 2015-07-25 DIAGNOSIS — R079 Chest pain, unspecified: Secondary | ICD-10-CM | POA: Insufficient documentation

## 2015-07-25 DIAGNOSIS — Z79899 Other long term (current) drug therapy: Secondary | ICD-10-CM | POA: Diagnosis not present

## 2015-07-25 DIAGNOSIS — E039 Hypothyroidism, unspecified: Secondary | ICD-10-CM | POA: Diagnosis not present

## 2015-07-25 DIAGNOSIS — Z8619 Personal history of other infectious and parasitic diseases: Secondary | ICD-10-CM | POA: Insufficient documentation

## 2015-07-25 LAB — CBC
HCT: 39.2 % (ref 36.0–46.0)
Hemoglobin: 13.1 g/dL (ref 12.0–15.0)
MCH: 30.2 pg (ref 26.0–34.0)
MCHC: 33.4 g/dL (ref 30.0–36.0)
MCV: 90.3 fL (ref 78.0–100.0)
PLATELETS: 184 10*3/uL (ref 150–400)
RBC: 4.34 MIL/uL (ref 3.87–5.11)
RDW: 11.9 % (ref 11.5–15.5)
WBC: 6.5 10*3/uL (ref 4.0–10.5)

## 2015-07-25 LAB — BASIC METABOLIC PANEL
Anion gap: 10 (ref 5–15)
BUN: 20 mg/dL (ref 6–20)
CALCIUM: 9.6 mg/dL (ref 8.9–10.3)
CO2: 26 mmol/L (ref 22–32)
Chloride: 104 mmol/L (ref 101–111)
Creatinine, Ser: 0.79 mg/dL (ref 0.44–1.00)
GFR calc Af Amer: >60 mL/min (ref >60)
GLUCOSE: 110 mg/dL — AB (ref 65–99)
Potassium: 4.1 mmol/L (ref 3.5–5.1)
Sodium: 140 mmol/L (ref 135–145)

## 2015-07-25 LAB — I-STAT TROPONIN, ED: TROPONIN I, POC: 0.01 ng/mL (ref 0.00–0.08)

## 2015-07-25 NOTE — Discharge Instructions (Signed)
Nonspecific Chest Pain  °Chest pain can be caused by many different conditions. There is always a chance that your pain could be related to something serious, such as a heart attack or a blood clot in your lungs. Chest pain can also be caused by conditions that are not life-threatening. If you have chest pain, it is very important to follow up with your health care provider. °CAUSES  °Chest pain can be caused by: °· Heartburn. °· Pneumonia or bronchitis. °· Anxiety or stress. °· Inflammation around your heart (pericarditis) or lung (pleuritis or pleurisy). °· A blood clot in your lung. °· A collapsed lung (pneumothorax). It can develop suddenly on its own (spontaneous pneumothorax) or from trauma to the chest. °· Shingles infection (varicella-zoster virus). °· Heart attack. °· Damage to the bones, muscles, and cartilage that make up your chest wall. This can include: °· Bruised bones due to injury. °· Strained muscles or cartilage due to frequent or repeated coughing or overwork. °· Fracture to one or more ribs. °· Sore cartilage due to inflammation (costochondritis). °RISK FACTORS  °Risk factors for chest pain may include: °· Activities that increase your risk for trauma or injury to your chest. °· Respiratory infections or conditions that cause frequent coughing. °· Medical conditions or overeating that can cause heartburn. °· Heart disease or family history of heart disease. °· Conditions or health behaviors that increase your risk of developing a blood clot. °· Having had chicken pox (varicella zoster). °SIGNS AND SYMPTOMS °Chest pain can feel like: °· Burning or tingling on the surface of your chest or deep in your chest. °· Crushing, pressure, aching, or squeezing pain. °· Dull or sharp pain that is worse when you move, cough, or take a deep breath. °· Pain that is also felt in your back, neck, shoulder, or arm, or pain that spreads to any of these areas. °Your chest pain may come and go, or it may stay  constant. °DIAGNOSIS °Lab tests or other studies may be needed to find the cause of your pain. Your health care provider may have you take a test called an ambulatory ECG (electrocardiogram). An ECG records your heartbeat patterns at the time the test is performed. You may also have other tests, such as: °· Transthoracic echocardiogram (TTE). During echocardiography, sound waves are used to create a picture of all of the heart structures and to look at how blood flows through your heart. °· Transesophageal echocardiogram (TEE). This is a more advanced imaging test that obtains images from inside your body. It allows your health care provider to see your heart in finer detail. °· Cardiac monitoring. This allows your health care provider to monitor your heart rate and rhythm in real time. °· Holter monitor. This is a portable device that records your heartbeat and can help to diagnose abnormal heartbeats. It allows your health care provider to track your heart activity for several days, if needed. °· Stress tests. These can be done through exercise or by taking medicine that makes your heart beat more quickly. °· Blood tests. °· Imaging tests. °TREATMENT  °Your treatment depends on what is causing your chest pain. Treatment may include: °· Medicines. These may include: °· Acid blockers for heartburn. °· Anti-inflammatory medicine. °· Pain medicine for inflammatory conditions. °· Antibiotic medicine, if an infection is present. °· Medicines to dissolve blood clots. °· Medicines to treat coronary artery disease. °· Supportive care for conditions that do not require medicines. This may include: °· Resting. °· Applying heat   or cold packs to injured areas. °· Limiting activities until pain decreases. °HOME CARE INSTRUCTIONS °· If you were prescribed an antibiotic medicine, finish it all even if you start to feel better. °· Avoid any activities that bring on chest pain. °· Do not use any tobacco products, including  cigarettes, chewing tobacco, or electronic cigarettes. If you need help quitting, ask your health care provider. °· Do not drink alcohol. °· Take medicines only as directed by your health care provider. °· Keep all follow-up visits as directed by your health care provider. This is important. This includes any further testing if your chest pain does not go away. °· If heartburn is the cause for your chest pain, you may be told to keep your head raised (elevated) while sleeping. This reduces the chance that acid will go from your stomach into your esophagus. °· Make lifestyle changes as directed by your health care provider. These may include: °· Getting regular exercise. Ask your health care provider to suggest some activities that are safe for you. °· Eating a heart-healthy diet. A registered dietitian can help you to learn healthy eating options. °· Maintaining a healthy weight. °· Managing diabetes, if necessary. °· Reducing stress. °SEEK MEDICAL CARE IF: °· Your chest pain does not go away after treatment. °· You have a rash with blisters on your chest. °· You have a fever. °SEEK IMMEDIATE MEDICAL CARE IF:  °· Your chest pain is worse. °· You have an increasing cough, or you cough up blood. °· You have severe abdominal pain. °· You have severe weakness. °· You faint. °· You have chills. °· You have sudden, unexplained chest discomfort. °· You have sudden, unexplained discomfort in your arms, back, neck, or jaw. °· You have shortness of breath at any time. °· You suddenly start to sweat, or your skin gets clammy. °· You feel nauseous or you vomit. °· You suddenly feel light-headed or dizzy. °· Your heart begins to beat quickly, or it feels like it is skipping beats. °These symptoms may represent a serious problem that is an emergency. Do not wait to see if the symptoms will go away. Get medical help right away. Call your local emergency services (911 in the [place] Do not drive yourself to the hospital. °  °This  information is not intended to replace advice given to you by your health care provider. Make sure you discuss any questions you have with your health care provider. °  °Document Released: 12/01/2004 Document Revised: 03/14/2014 Document Reviewed: 09/27/2013 °Elsevier Interactive Patient Education ©2016 [employer] ° °Palpitations °A palpitation is the feeling that your heartbeat is irregular or is faster than normal. It may feel like your heart is fluttering or skipping a beat. Palpitations are usually not a serious problem. However, in some cases, you may need further medical evaluation. °CAUSES  °Palpitations can be caused by: °· Smoking. °· Caffeine or other stimulants, such as diet pills or energy drinks. °· Alcohol. °· Stress and anxiety. °· Strenuous physical activity. °· Fatigue. °· Certain medicines. °· Heart disease, especially if you have a history of irregular heart rhythms (arrhythmias), such as atrial fibrillation, atrial flutter, or supraventricular tachycardia. °· An improperly working pacemaker or defibrillator. °DIAGNOSIS  °To find the cause of your palpitations, your health care provider will take your medical history and perform a physical exam. Your health care provider may also have you take a test called an ambulatory electrocardiogram (ECG). An ECG records your heartbeat patterns over a 24-hour   period. You may also have other tests, such as: °· Transthoracic echocardiogram (TTE). During echocardiography, sound waves are used to evaluate how blood flows through your heart. °· Transesophageal echocardiogram (TEE). °· Cardiac monitoring. This allows your health care provider to monitor your heart rate and rhythm in real time. °· Holter monitor. This is a portable device that records your heartbeat and can help diagnose heart arrhythmias. It allows your health care provider to track your heart activity for several days, if needed. °· Stress tests by exercise or by giving medicine that makes the  heart beat faster. °TREATMENT  °Treatment of palpitations depends on the cause of your symptoms and can vary greatly. Most cases of palpitations do not require any treatment other than time, relaxation, and monitoring your symptoms. Other causes, such as atrial fibrillation, atrial flutter, or supraventricular tachycardia, usually require further treatment. °HOME CARE INSTRUCTIONS  °· Avoid: °¨ Caffeinated coffee, tea, soft drinks, diet pills, and energy drinks. °¨ Chocolate. °¨ Alcohol. °· Stop smoking if you smoke. °· Reduce your stress and anxiety. Things that can help you relax include: °¨ A method of controlling things in your body, such as your heartbeats, with your mind (biofeedback). °¨ Yoga. °¨ Meditation. °¨ Physical activity such as swimming, jogging, or walking. °· Get plenty of rest and sleep. °SEEK MEDICAL CARE IF:  °· You continue to have a fast or irregular heartbeat beyond 24 hours. °· Your palpitations occur more often. °SEEK IMMEDIATE MEDICAL CARE IF: °· You have chest pain or shortness of breath. °· You have a severe headache. °· You feel dizzy or you faint. °MAKE SURE YOU: °· Understand these instructions. °· Will watch your condition. °· Will get help right away if you are not doing well or get worse. °  °This information is not intended to replace advice given to you by your health care provider. Make sure you discuss any questions you have with your health care provider. °  °Document Released: 02/19/2000 Document Revised: 02/26/2013 Document Reviewed: 04/22/2011 °Elsevier Interactive Patient Education ©2016 [employer] Inc[employer] ° °

## 2015-07-25 NOTE — ED Notes (Signed)
Pt stable, ambulatory, states understanding of discharge instructions 

## 2015-07-25 NOTE — ED Notes (Signed)
Pt brought to xray from lobby

## 2015-07-25 NOTE — ED Provider Notes (Signed)
CSN: [id number]     Arrival date & time 07/25/15  0045 History   First MD Initiated Contact with Patient 07/25/15 0149     Chief Complaint  Patient presents with  . Chest Pain     (Consider location/radiation/quality/duration/timing/severity/associated sxs/prior Treatment) HPI   Patient with PMH of lyme disease, hypothyroid, hair loss and palpitations presents to the ER with complaints of chest pain that started a few days ago. The patient describes her pain as being to her back on the left and her left arm. She also endorses being nauseous. Denies diaphoresis. The patients pain has persisted for 4 days. She see's Dr. Sharyn LullHarwani and has discussed her symptoms with him, he did not feel at the time that she needed another stress test. She has had a normal cardiac echo, stress test and EKG per patient within the past 1.5 years.  She woke up this evening and felt like her pain was worse and therefore came to the ER. She reports that she feels better and requests discharge. She feels like she would be better to go home and sleep and call Dr. Sharyn LullHarwani tomorrow. She currently is pain free. She described the symptoms as palpitations that feels "weird" in her chest but not true pain.   Past Medical History  Diagnosis Date  . Lyme disease   . Hypothyroid   . Hair loss   . Palpitations    Past Surgical History  Procedure Laterality Date  . Dilation and curettage of uterus  1971   History reviewed. No pertinent family history. Social History  Substance Use Topics  . Smoking status: Never Smoker   . Smokeless tobacco: Never Used  . Alcohol Use: No     Comment: quit at age 65   OB History    No data available     Review of Systems  Review of Systems All other systems negative except as documented in the HPI. All pertinent positives and negatives as reviewed in the HPI.   Allergies  Review of patient's allergies indicates no known allergies.  Home Medications   Prior to Admission  medications   Medication Sig Start Date End Date Taking? Authorizing Provider  b complex vitamins tablet Take 1 tablet by mouth 2 (two) times daily.   Yes Historical Provider, MD  Fish Oil OIL Take 5 mLs by mouth daily.   Yes Historical Provider, MD  Misc Natural Products (PROGESTERONE EX) Apply topically 2 (two) times daily.   Yes Historical Provider, MD  PRESCRIPTION MEDICATION Apply 1 application topically 2 (two) times daily. Estrogen cream. Pharmacy listed says the did not fill this   Yes Historical Provider, MD  thyroid (ARMOUR) 65 MG tablet Take 65 mg by mouth daily.   Yes Historical Provider, MD   BP 138/66 mmHg  Pulse 79  Temp(Src) 97.5 F (36.4 C) (Oral)  Resp 16  SpO2 99% Physical Exam  Constitutional: She appears well-developed and well-nourished. No distress.  HENT:  Head: Normocephalic and atraumatic.  Nose: Nose normal.  Mouth/Throat: Uvula is midline, oropharynx is clear and moist and mucous membranes are normal.  Eyes: Pupils are equal, round, and reactive to light.  Neck: Normal range of motion. Neck supple.  Cardiovascular: Normal rate and regular rhythm.   Pulmonary/Chest: Effort normal.  Abdominal: Soft.  No signs of abdominal distention  Musculoskeletal:  No LE swelling  Neurological: She is alert.  Acting at baseline  Skin: Skin is warm and dry. No rash noted.  Nursing note and vitals  reviewed.   ED Course  Procedures (including critical care time) Labs Review Labs Reviewed  BASIC METABOLIC PANEL - Abnormal; Notable for the following:    Glucose, Bld 110 (*)    All other components within normal limits  CBC  I-STAT TROPOININ, ED    Imaging Review Dg Chest 2 View  07/25/2015  CLINICAL DATA:  65 year old female with chest pain and palpitations EXAM: CHEST  2 VIEW COMPARISON:  Radiograph dated 06/07/2014 FINDINGS: Two views of the chest demonstrate emphysematous changes of the lungs. No focal consolidation, pleural effusion, or pneumothorax. Stable 7  mm right lower lung field calcified granuloma. The cardiac silhouette is within normal limits with no acute osseous pathology. IMPRESSION: No active cardiopulmonary disease. Electronically Signed   By: Elgie Collard M.D.   On: 07/25/2015 02:28   I have personally reviewed and evaluated these images and lab results as part of my medical decision-making.   EKG Interpretation   Date/Time:  Saturday Jul 25 2015 00:52:35 EDT Ventricular Rate:  72 PR Interval:  136 QRS Duration: 80 QT Interval:  408 QTC Calculation: 446 R Axis:   84 Text Interpretation:  Normal sinus rhythm Normal ECG No significant change  since last tracing Confirmed by WARD,  DO, KRISTEN ([id number]) on 07/25/2015  1:21:24 AM      MDM   Final diagnoses:  Chest pain, unspecified chest pain type   Patients symptoms are atypical and has been persisting for the past 4 days, but admits she has been having pains for a few years which she see's Dr. Sharyn Lull for- - neg stress test and echo per patient report within the past 1.5 years.  She is not happy with her visit today, says she has been here for two hours, has not seen a nurse and is not connected to the HR monitor. She says her symptoms have completely resolved and she does not want admission or obs. She wants to go home and rest.   Patient is to be discharged with recommendation to follow up with PCP in regards to today's hospital visit. Chest pain is not likely of cardiac or pulmonary etiology d/t presentation, perc negative, VSS, no tracheal deviation, no JVD or new murmur, RRR, breath sounds equal bilaterally, EKG without acute abnormalities, negative troponin, and negative CXR. Pt has been advised to return to the ED is CP becomes exertional, associated with diaphoresis or nausea, radiates to left jaw/arm, worsens or becomes concerning in any way. Pt appears reliable for follow up and is agreeable to discharge.      Marlon Pel, PA-C 07/25/15 2130  Zadie Rhine, MD 07/25/15 [phone]

## 2015-07-25 NOTE — ED Notes (Signed)
Chest pain that started a few days ago.  More left sided that goes to her back and left arm.  Having nausea along with the pain.  Patient A&Ox4 not diaphoretic

## 2015-07-27 ENCOUNTER — Other Ambulatory Visit: Payer: Self-pay | Admitting: Cardiology

## 2015-07-27 DIAGNOSIS — R079 Chest pain, unspecified: Secondary | ICD-10-CM

## 2015-08-05 ENCOUNTER — Other Ambulatory Visit (HOSPITAL_COMMUNITY): Payer: BLUE CROSS/BLUE SHIELD

## 2015-08-05 ENCOUNTER — Ambulatory Visit (HOSPITAL_COMMUNITY): Payer: BLUE CROSS/BLUE SHIELD

## 2015-08-17 ENCOUNTER — Encounter (HOSPITAL_COMMUNITY): Payer: BLUE CROSS/BLUE SHIELD

## 2015-08-17 ENCOUNTER — Other Ambulatory Visit (HOSPITAL_COMMUNITY): Payer: BLUE CROSS/BLUE SHIELD

## 2015-08-28 ENCOUNTER — Encounter (HOSPITAL_COMMUNITY): Payer: BLUE CROSS/BLUE SHIELD

## 2015-11-22 ENCOUNTER — Encounter (HOSPITAL_COMMUNITY): Payer: Self-pay

## 2015-11-22 ENCOUNTER — Emergency Department (HOSPITAL_COMMUNITY)
Admission: EM | Admit: 2015-11-22 | Discharge: 2015-11-22 | Disposition: A | Discharge status: Home / Self Care | Payer: BLUE CROSS/BLUE SHIELD | Attending: Emergency Medicine | Admitting: Emergency Medicine

## 2015-11-22 DIAGNOSIS — Z203 Contact with and (suspected) exposure to rabies: Secondary | ICD-10-CM | POA: Insufficient documentation

## 2015-11-22 DIAGNOSIS — E039 Hypothyroidism, unspecified: Secondary | ICD-10-CM | POA: Diagnosis not present

## 2015-11-22 DIAGNOSIS — Z209 Contact with and (suspected) exposure to unspecified communicable disease: Secondary | ICD-10-CM

## 2015-11-22 MED ORDER — RABIES VACCINE, PCEC IM SUSR
1.0000 mL | Freq: Once | INTRAMUSCULAR | Status: AC
  Administered 2015-11-22: 1 mL via INTRAMUSCULAR
  Filled 2015-11-22: qty 1

## 2015-11-22 MED ORDER — RABIES IMMUNE GLOBULIN 150 UNIT/ML IM INJ
20.0000 [IU]/kg | INJECTION | Freq: Once | INTRAMUSCULAR | Status: AC
  Administered 2015-11-22: 1275 [IU] via INTRAMUSCULAR
  Filled 2015-11-22: qty 8.5

## 2015-11-22 NOTE — Discharge Instructions (Signed)
Read the information below.  You may return to the Emergency Department at any time for worsening condition or any new symptoms that concern you.  Please follow up at urgent care as directed.
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
## 2015-11-22 NOTE — ED Notes (Signed)
Declined W/C at D/C and was escorted to lobby by RN. 

## 2015-11-22 NOTE — ED Provider Notes (Signed)
[facility]Y DEPT Provider Note   CSN: [id number] Arrival date & time: 11/22/15  1220  By signing my name below, I, Christy SartoriusAnastasia Kolousek, attest that this documentation has been prepared under the direction and in the presence of  [facility] Jacksonville[facility]ily Leman Martinek, PA-C. Electronically Signed: Christy SartoriusAnastasia Kolousek, ED Scribe. 11/22/15. 2:09 PM.  History   Chief Complaint Chief Complaint  Patient presents with  . "Bat Encounter"    The history is provided by the patient and medical records. No language interpreter was used.    HPI Comments:  Kaitlyn Faulkner is a 65 y.o. female with a history of chronic Lyme's disease who presents to the Emergency Department after contact with a bat.  She notes that the bat flew into her head and the impact hurt.   Her significant other was unable to find a break in the skin of her scalp. Her tetanus is out of date.  She denies fever, laceration, significant pain or headache, and additional symptoms or complaint.  She is not currently on any medications.    Past Medical History:  Diagnosis Date  . Hair loss   . Hypothyroid   . Lyme disease   . Palpitations     Patient Active Problem List   Diagnosis Date Noted  . DYSPEPSIA 01/06/2009    Past Surgical History:  Procedure Laterality Date  . DILATION AND CURETTAGE OF UTERUS  1971    OB History    No data available       Home Medications    Prior to Admission medications   Medication Sig Start Date End Date Taking? Authorizing Provider  b complex vitamins tablet Take 1 tablet by mouth 2 (two) times daily.    Historical Provider, MD  Fish Oil OIL Take 5 mLs by mouth daily.    Historical Provider, MD  Misc Natural Products (PROGESTERONE EX) Apply topically 2 (two) times daily.    Historical Provider, MD  PRESCRIPTION MEDICATION Apply 1 application topically 2 (two) times daily. Estrogen cream. Pharmacy listed says the did not fill this    Historical Provider, MD  thyroid (ARMOUR) 65 MG tablet Take 65 mg by mouth  daily.    Historical Provider, MD    Family History No family history on file.  Social History Social History  Substance Use Topics  . Smoking status: Never Smoker  . Smokeless tobacco: Never Used  . Alcohol use No     Comment: quit at age 65     Allergies   Review of patient's allergies indicates no known allergies.   Review of Systems Review of Systems  Constitutional: Negative for activity change, appetite change and fever.  HENT: Negative for facial swelling.   Skin: Negative for color change, pallor and wound.  Neurological: Negative for headaches.  Hematological: Does not bruise/bleed easily.  Psychiatric/Behavioral: Negative for self-injury.     Physical Exam Updated Vital Signs BP 126/59 (BP Location: Right Arm)   Pulse 60   Temp 98 F (36.7 C) (Oral)   Resp 16   Ht 5\' 9"  (1.753 m)   Wt 62.6 kg   SpO2 99%   BMI 20.38 kg/m   Physical Exam  Constitutional: She appears well-developed and well-nourished.  HENT:  Head: Normocephalic and atraumatic.  Neck: Neck supple.  Pulmonary/Chest: Effort normal.  Neurological: She is alert.  Skin:  No abrasion, laceration or other lesions noted on the scalp.  No discoloration.    Nursing note and vitals reviewed.    ED Treatments / Results  DIAGNOSTIC STUDIES:  Oxygen Saturation is 98% on RA, NML by my interpretation.    COORDINATION OF CARE:  2:11 PM Discussed treatment plan with pt at bedside and pt agreed to plan.  Labs (all labs ordered are listed, but only abnormal results are displayed) Labs Reviewed - No data to display  EKG  EKG Interpretation None       Radiology No results found.  Procedures Procedures (including critical care time)  Medications Ordered in ED Medications  rabies vaccine (RABAVERT) injection 1 mL (1 mL Intramuscular Given 11/22/15 1522)  rabies immune globulin (HYPERAB) injection 1,275 Units (1,275 Units Intramuscular Given 11/22/15 1521)     Initial Impression  / Assessment and Plan / ED Course  I have reviewed the triage vital signs and the nursing notes.  Pertinent labs & imaging results that were available during my care of the patient were reviewed by me and considered in my medical decision making (see chart for details).  Clinical Course    Afebrile, nontoxic patient with episode yesterday of a bat flying into her hair/head without visible bite presents for rabies injections.  Immunoglobulin and vaccine given.  Pt declined tetanus vaccine though I strongly encouraged it.  She is concerned that her immune system won't be strong enough given her chronic lyme disease.  I encouraged her to see her primary care provider on a day this week she wasn't getting rabies vaccinations to have this updated.   D/C home with schedule for vaccination follow up.   Discussed result, findings, treatment, and follow up  with patient.  Pt given return precautions.  Pt verbalizes understanding and agrees with plan.       Final Clinical Impressions(s) / ED Diagnoses   Final diagnoses:  Exposure to bat without known bite    New Prescriptions Discharge Medication List as of 11/22/2015  3:57 PM     I personally performed the services described in this documentation, which was scribed in my presence. The recorded information has been reviewed and is accurate.     Trixie Dredge, PA-C 11/22/15 1640    Shaune Pollack, MD 11/24/15 [phone]

## 2015-11-25 ENCOUNTER — Ambulatory Visit (HOSPITAL_COMMUNITY)
Admission: EM | Admit: 2015-11-25 | Discharge: 2015-11-25 | Disposition: A | Discharge status: Home / Self Care | Payer: BLUE CROSS/BLUE SHIELD | Attending: Nurse Practitioner | Admitting: Nurse Practitioner

## 2015-11-25 ENCOUNTER — Encounter (HOSPITAL_COMMUNITY): Payer: Self-pay | Admitting: *Deleted

## 2015-11-25 DIAGNOSIS — Z203 Contact with and (suspected) exposure to rabies: Secondary | ICD-10-CM

## 2015-11-25 MED ORDER — RABIES VACCINE, PCEC IM SUSR
INTRAMUSCULAR | Status: AC
  Filled 2015-11-25: qty 1

## 2015-11-25 MED ORDER — RABIES VACCINE, PCEC IM SUSR
1.0000 mL | Freq: Once | INTRAMUSCULAR | Status: AC
  Administered 2015-11-25: 1 mL via INTRAMUSCULAR

## 2015-11-25 NOTE — ED Triage Notes (Signed)
Pt  Is  Here  For  Rabies  Vaccine        nerbalizes no   Complaints

## 2015-11-25 NOTE — Discharge Instructions (Signed)
Followup   As  Directed      Return  As  Needed

## 2015-11-30 ENCOUNTER — Encounter (HOSPITAL_COMMUNITY): Payer: Self-pay | Admitting: Emergency Medicine

## 2015-11-30 ENCOUNTER — Ambulatory Visit (HOSPITAL_COMMUNITY)
Admission: EM | Admit: 2015-11-30 | Discharge: 2015-11-30 | Disposition: A | Discharge status: Home / Self Care | Payer: BLUE CROSS/BLUE SHIELD | Attending: Family Medicine | Admitting: Family Medicine

## 2015-11-30 DIAGNOSIS — Z203 Contact with and (suspected) exposure to rabies: Secondary | ICD-10-CM | POA: Diagnosis not present

## 2015-11-30 MED ORDER — RABIES VACCINE, PCEC IM SUSR
1.0000 mL | Freq: Once | INTRAMUSCULAR | Status: AC
  Administered 2015-11-30: 1 mL via INTRAMUSCULAR

## 2015-11-30 MED ORDER — RABIES VACCINE, PCEC IM SUSR
INTRAMUSCULAR | Status: AC
  Filled 2015-11-30: qty 1

## 2015-11-30 NOTE — ED Triage Notes (Signed)
Patient is here for the next injection in rabies series.  No complaints.

## 2015-12-08 ENCOUNTER — Encounter (HOSPITAL_COMMUNITY): Payer: Self-pay | Admitting: Emergency Medicine

## 2015-12-08 ENCOUNTER — Ambulatory Visit (HOSPITAL_COMMUNITY)
Admission: EM | Admit: 2015-12-08 | Discharge: 2015-12-08 | Disposition: A | Discharge status: Home / Self Care | Payer: BLUE CROSS/BLUE SHIELD | Attending: Physician Assistant | Admitting: Physician Assistant

## 2015-12-08 DIAGNOSIS — Z203 Contact with and (suspected) exposure to rabies: Secondary | ICD-10-CM

## 2015-12-08 MED ORDER — RABIES VACCINE, PCEC IM SUSR
1.0000 mL | Freq: Once | INTRAMUSCULAR | Status: AC
  Administered 2015-12-08: 1 mL via INTRAMUSCULAR

## 2015-12-08 MED ORDER — RABIES VACCINE, PCEC IM SUSR
INTRAMUSCULAR | Status: AC
  Filled 2015-12-08: qty 1

## 2015-12-08 NOTE — Discharge Instructions (Signed)
Please return to the Urgent Care Center with any other concerns or complications related to the Rabies Vaccination series.

## 2015-12-08 NOTE — ED Triage Notes (Signed)
[facility] last in the rabies injection series.

## 2016-03-28 ENCOUNTER — Other Ambulatory Visit: Payer: Self-pay | Admitting: Cardiology

## 2016-03-28 DIAGNOSIS — R0789 Other chest pain: Secondary | ICD-10-CM

## 2016-03-29 ENCOUNTER — Telehealth: Payer: Self-pay | Admitting: Cardiovascular Disease

## 2016-03-29 NOTE — Telephone Encounter (Signed)
New Message     When are you going to request her medical records from her old cardiologist?

## 2016-03-30 NOTE — Telephone Encounter (Signed)
Spoke with patient to verify appointment for tomorrow with Dr. Elease HashimotoNahser. She states she does have some nausea and shoulder pain with her chest pressure. She is in agreement with plan for appointment with Dr. Elease HashimotoNahser and thanked me for the call.

## 2016-03-30 NOTE — Telephone Encounter (Signed)
New Message     She is switching over to Dr Elease HashimotoNahser, if she has a emergency can she have them call Dr Elease HashimotoNahser instead of her old cardiologist    What is difference between stress test and Cath test?

## 2016-03-30 NOTE — Telephone Encounter (Signed)
Spoke with patient who called to ask if she goes to the ER will she say she is a patient of Dr. Elease HashimotoNahser. I asked her what symptoms she is having that she might need to go the ER for and she states she has frequent chest pressure. She denies SOB, nausea, diaphoresis. She has been evaluated by Dr. Sharyn LullHarwani but would like a second opinion.  I moved her appointment to tomorrow, Jan. 25 at 11:15.

## 2016-03-31 ENCOUNTER — Ambulatory Visit (INDEPENDENT_AMBULATORY_CARE_PROVIDER_SITE_OTHER): Payer: BLUE CROSS/BLUE SHIELD | Admitting: Cardiovascular Disease

## 2016-03-31 ENCOUNTER — Encounter: Payer: Self-pay | Admitting: Cardiovascular Disease

## 2016-03-31 DIAGNOSIS — I951 Orthostatic hypotension: Secondary | ICD-10-CM | POA: Insufficient documentation

## 2016-03-31 MED ORDER — PROPRANOLOL HCL 10 MG PO TABS: 10.0000 mg | ORAL_TABLET | Freq: Four times a day (QID) | ORAL | 11 refills | Status: DC | PRN

## 2016-03-31 NOTE — Patient Instructions (Signed)
Medication Instructions:  STOP Toprol START Propranolol 10 mg up to 4 times per day as needed   Labwork: None Ordered   Testing/Procedures: None Ordered Your myoview has been cancelled   Follow-Up: Your physician recommends that you schedule a follow-up appointment in: 6 weeks with Dr. Elease HashimotoNahser   If you need a refill on your cardiac medications before your next appointment, please call your pharmacy.   Thank you for choosing [facility]! Eligha BridegroomMichelle Swinyer, RN [phone]

## 2016-03-31 NOTE — Progress Notes (Addendum)
Cardiology Office Note   Date:  03/31/2016   ID:  Kaitlyn Faulkner, DOB 04/28/1950, MRN [id number]  PCP:  Jeanann Lewandowsky, MD  [name].  Cardiologist:   Kristeen Miss, MD   Chief Complaint  Patient presents with  . Chest Pain   Problem List 1. Chest pain  2. Lyme disease  3. Palpitations    History of Present Illness: Kaitlyn Faulkner is a 66 y.o. female who presents for eval of chest pain  Has had Lyme disease - diagnosed with Lyme disease 2 years ago .   Is having some neuro issues  Has also been having chest pain issues Feels like "something is taking over her body" Ringing in her ears,   Gets sweats,  Nauseated,  Pressure in left side of neck, left arm pain  Can last several hours Had an episode last night  Has several episodes each week  Is not associated with standing  Occasionally is brought on my eating   Has had a stress test 2 years ago, ECGs always look fine. Has been seen in the ER several times and nothing has shown up Has had an echo  Has been to a medical intuitive doctor - who thought that she had some pulmonary issues.    Father had heart disease in his 66   Has Lyme disease .  She did not complete the course of Abx.  Caused stomach issues Is considering getting Ozone treatments at [facility]   Is vegetarian, has been losing weight  Eats 2 pea protein smoothies a day .  Not much other protein . Maybe 1/2 an egg a week.    Past Medical History:  Diagnosis Date  . Hair loss   . Hypothyroid   . Lyme disease   . Palpitations     Past Surgical History:  Procedure Laterality Date  . DILATION AND CURETTAGE OF UTERUS  1971     Current Outpatient Prescriptions  Medication Sig Dispense Refill  . b complex vitamins tablet Take 1 tablet by mouth 2 (two) times daily.    . Fish Oil OIL Take 5 mLs by mouth daily.    . metoprolol succinate (TOPROL-XL) 25 MG 24 hr tablet Take 25 mg by mouth daily.  2  . Misc Natural Products (PROGESTERONE EX) Apply  topically 2 (two) times daily.    Marland Kitchen PRESCRIPTION MEDICATION Apply 1 application topically 2 (two) times daily. Estrogen cream. Pharmacy listed says the did not fill this    . thyroid (ARMOUR) 65 MG tablet Take 65 mg by mouth daily.     No current facility-administered medications for this visit.     Allergies:   Patient has no known allergies.    Social History:  The patient  reports that she has never smoked. She has never used smokeless tobacco. She reports that she does not drink alcohol or use drugs.   Family History:  The patient's family history includes CAD in her father; Parkinson's disease in her mother.    ROS:  Please see the history of present illness.    Review of Systems: Constitutional:  denies fever, chills, diaphoresis, appetite change and fatigue.  HEENT: denies photophobia, eye pain, redness, hearing loss, ear pain, congestion, sore throat, rhinorrhea, sneezing, neck pain, neck stiffness and tinnitus.  Respiratory: denies SOB, DOE, cough, chest tightness, and wheezing.  Cardiovascular: denies chest pain, palpitations and leg swelling.  Gastrointestinal: denies nausea, vomiting, abdominal pain, diarrhea, constipation, blood in stool.  Genitourinary: denies dysuria, urgency, frequency,  hematuria, flank pain and difficulty urinating.  Musculoskeletal: denies  myalgias, back pain, joint swelling, arthralgias and gait problem.   Skin: denies pallor, rash and wound.  Neurological: denies dizziness, seizures, syncope, weakness, light-headedness, numbness and headaches.   Hematological: denies adenopathy, easy bruising, personal or family bleeding history.  Psychiatric/ Behavioral: denies suicidal ideation, mood changes, confusion, nervousness, sleep disturbance and agitation.       All other systems are reviewed and negative.    PHYSICAL EXAM: VS:  BP 90/62 (BP Location: Right Arm, Patient Position: Sitting, Cuff Size: Normal)   Pulse (!) 56   Ht 5\' 9"  (1.753 m)    Wt 133 lb 12.8 oz (60.7 kg)   SpO2 99%   BMI 19.76 kg/m  , BMI Body mass index is 19.76 kg/m. GEN: Well nourished, well developed, in no acute distress  HEENT: normal  Neck: no JVD, carotid bruits, or masses Cardiac: RRR; no murmurs, rubs, or gallops,no edema  Respiratory:  clear to auscultation bilaterally, normal work of breathing GI: soft, nontender, nondistended, + BS MS: no deformity or atrophy  Skin: warm and dry, no rash Neuro:  Strength and sensation are intact Psych: normal   EKG:  EKG is not ordered today.    Recent Labs: 07/25/2015: BUN 20; Creatinine, Ser 0.79; Hemoglobin 13.1; Platelets 184; Potassium 4.1; Sodium 140    Lipid Panel No results found for: CHOL, TRIG, HDL, CHOLHDL, VLDL, LDLCALC, LDLDIRECT    Wt Readings from Last 3 Encounters:  03/31/16 133 lb 12.8 oz (60.7 kg)  11/22/15 138 lb (62.6 kg)  01/06/09 133 lb 2.1 oz (60.4 kg)      Other studies Reviewed: Additional studies/ records that were reviewed today include: . Review of the above records demonstrates:    ASSESSMENT AND PLAN:  1.  Hypotension:   I suspect that her symptoms are related to hypotension - lightheadedness, ringing in ears, CP, She has a low BP at baseline Has had a normal echo at Dr. Annitta JerseyHArwani's office several years ago and a normal stress nuclear study several years ago   Her symptoms do not sound like coronary ischemia to me.    I think she is volume depleted because of her insufficient intake of protein and solutes.  I've recommended that she greatly increase her intake of sodium potassium and protein.    She cannot tolerate V-8 ( too acidic )  Will get sea salt , use liberally Increase intake of eggs, peanut butter, yogart, . Will call us in a week or so to report how she is feeling.  We can get further studies if she is not improving   2. Chronic lyme disease:    Plans per [facility].   Would consider referral to ID if she has additional issues  3 CP   :    Does not sound cardiac  Will cancel the myoview for Monday .   Will reschedule if she develops symptoms that sound like angina   Current medicines are reviewed at length with the patient today.  The patient does not have concerns regarding medicines.  Labs/ tests ordered today include:  No orders of the defined types were placed in this encounter.    Disposition:   FU with me in 6 weeks  She will call Marcelino DusterMichelle next week to let us know how she is doing.      Kristeen MissPhilip Nahser, MD  03/31/2016 12:17 PM    [facility] [address],  [place], [place]  [address] Phone: [phone]; Fax: [phone]   Addendum:  Celine Ahr shows a subtile anterior and apical defect. She is quite thin and this is unlikely to be breast artifact. She continues to have significant symptoms. We discussed CT angiogram and cath Given her symptoms , I think the cath will be better at really giving [place] an answer with her coronaries.  I've explained the risks, benefits, options of cardiac cath. She understands and agrees to proceed.

## 2016-04-04 ENCOUNTER — Telehealth: Payer: Self-pay | Admitting: Cardiovascular Disease

## 2016-04-04 ENCOUNTER — Encounter (HOSPITAL_COMMUNITY): Payer: Self-pay

## 2016-04-04 ENCOUNTER — Encounter (HOSPITAL_COMMUNITY): Payer: BLUE CROSS/BLUE SHIELD

## 2016-04-04 DIAGNOSIS — A6920 Lyme disease, unspecified: Secondary | ICD-10-CM

## 2016-04-04 MED ORDER — PROPRANOLOL HCL 10 MG PO TABS: 10.0000 mg | ORAL_TABLET | Freq: Four times a day (QID) | ORAL | 11 refills | Status: DC | PRN

## 2016-04-04 NOTE — Telephone Encounter (Signed)
Spoke with patient and advised that [facility]. Patient states she would like Rx to [facility] to [facility]. I advised I have made that change in the computer.  Patient requests referral for infectious disease doctor for lyme disease. I advised that I am placing that order now and that patient should receive a call for an appointment. She discussed potential PCPs in the area and I offered her several options. She thanked me for the call.

## 2016-04-04 NOTE — Telephone Encounter (Signed)
New Message   Per pt Dr. Elease HashimotoNahser recommended she see a Infectious Disease Doctor, and wanted to see if he could recommend someone. Requesting call back.

## 2016-04-04 NOTE — Telephone Encounter (Signed)
New message    [facility] verbalized that Pt was expecting that an prescription, was suppose to be called in since last Thursday and she is unknown of the name

## 2016-04-05 ENCOUNTER — Telehealth: Payer: Self-pay | Admitting: Cardiovascular Disease

## 2016-04-05 DIAGNOSIS — R079 Chest pain, unspecified: Secondary | ICD-10-CM

## 2016-04-05 NOTE — Telephone Encounter (Signed)
Spoke with patient who states she has been feeling better since making dietary improvements but she had 2 episodes of chest pain yesterday and last night.  She denies stress at the time of these events and states pain was in chest and radiated into left arm and left neck and face.  States she also felt some tingling and was nauseated during both events.  She does not note discomfort with exertion.  States her husband took her BP and it was 150/90 mmHg on 2 different occasions. Denies diaphoresis but states she felt overheated. Patient would like to have echo and/or stress test if Dr. Elease HashimotoNahser feels these are appropriate.  I discussed her concerns with Dr. Elease HashimotoNahser who is in the office. He advised he is willing to order exercise stress test and echo for further evaluation. He advised she continue to monitor BP and if it remains high, to reduce salt intake. I advised that I will place orders for tests and someone from our scheduling department will call her to schedule. I talked with her for >20 minutes about cardiac chest pain and encouraged her to call back with worsening symptoms or concerns. She thanked me for the call.

## 2016-04-05 NOTE — Telephone Encounter (Signed)
New Message  Pt voiced having heart issues and wondering she can have an Echo ordered.  Please f/u with pt

## 2016-04-05 NOTE — Telephone Encounter (Signed)
Left message for patient to call back.  Dr. Elease HashimotoNahser would like to know if patient's symptoms have improved since she has worked on staying better hydrated and eating more protein and potassium.

## 2016-04-06 ENCOUNTER — Ambulatory Visit (HOSPITAL_COMMUNITY): Payer: BLUE CROSS/BLUE SHIELD | Attending: Cardiovascular Disease

## 2016-04-06 ENCOUNTER — Other Ambulatory Visit: Payer: Self-pay

## 2016-04-06 DIAGNOSIS — I351 Nonrheumatic aortic (valve) insufficiency: Secondary | ICD-10-CM | POA: Diagnosis not present

## 2016-04-06 DIAGNOSIS — R079 Chest pain, unspecified: Secondary | ICD-10-CM | POA: Diagnosis present

## 2016-04-07 ENCOUNTER — Ambulatory Visit: Payer: BLUE CROSS/BLUE SHIELD | Admitting: Cardiovascular Disease

## 2016-04-07 ENCOUNTER — Telehealth (HOSPITAL_COMMUNITY): Payer: Self-pay | Admitting: *Deleted

## 2016-04-07 ENCOUNTER — Telehealth: Payer: Self-pay | Admitting: Cardiovascular Disease

## 2016-04-07 DIAGNOSIS — R0789 Other chest pain: Secondary | ICD-10-CM

## 2016-04-07 NOTE — Telephone Encounter (Signed)
Spoke with patient and reviewed the results of her echocardiogram. She verbalized understanding of these results but remains concerned about her symptoms. She states she had an episode last night where she got a generalized fatigued feeling followed by feeling hot and overheated.  States she laid down to rest and then started to feel pressure across her chest, arm, and face and nausea.  States these symptoms occurred yesterday several hours after taking a walk. She states she has not had symptoms of menopause in many years. Reports strong family hx of CAD; her father had double bypass surgery in his 3160's. I asked if her thyroid levels have been checked recently.  She takes armour thyroid daily but cannot recall recent lab results. I advised Dr. Elease HashimotoNahser may want to order a TSH and/or additional labs when she comes in for stress test next week. She continues to have a lot of questions about what might be causing her symptoms. I answered questions to her satisfaction and spent approximately 20 minutes on the phone with her. She thanked me for my time.

## 2016-04-07 NOTE — Telephone Encounter (Signed)
Patient given detailed instructions per Myocardial Perfusion Study Information Sheet for the test on 04/12/16. Patient notified to arrive 15 minutes early and that it is imperative to arrive on time for appointment to keep from having the test rescheduled.  If you need to cancel or reschedule your appointment, please call the office within 24 hours of your appointment. Failure to do so may result in a cancellation of your appointment, and a $50 no show fee. Patient verbalized understanding. Kaitlyn Faulkner Jacqueline    

## 2016-04-07 NOTE — Telephone Encounter (Signed)
[name] is calling because she states that she had an echo yesterday, but she had a couple of bad episodes last night. She would like to speak with someone about the episodes. Thanks.

## 2016-04-08 NOTE — Telephone Encounter (Signed)
Left patient detailed message that Dr. Elease HashimotoNahser wants to order cardiac CT. I explained in the message the reason for the test and advised that someone from our office will call her to schedule. I advised she call back with questions or concerns.

## 2016-04-08 NOTE — Telephone Encounter (Signed)
Her symptoms are atypical. Will do the stress myoview Lets consider a coronary CT scan ( I will get specifics from dr. Delton SeeNelson )

## 2016-04-08 NOTE — Addendum Note (Signed)
Addended by: Levi AlandSWINYER, MICHELLE M on: 04/08/2016 05:36 PM   Modules accepted: Orders

## 2016-04-11 ENCOUNTER — Telehealth: Payer: Self-pay | Admitting: Cardiovascular Disease

## 2016-04-11 NOTE — Telephone Encounter (Signed)
Spoke with patient to make the appointment for the Cardiac Ct.  Patient want to wait till after she receive the results of the Myoview she is having on  04-12-16.  She stated this will tell if she need the Cardiac Ct.

## 2016-04-12 ENCOUNTER — Ambulatory Visit (HOSPITAL_COMMUNITY): Payer: BLUE CROSS/BLUE SHIELD | Attending: Cardiology

## 2016-04-12 ENCOUNTER — Telehealth: Payer: Self-pay | Admitting: Cardiovascular Disease

## 2016-04-12 DIAGNOSIS — R079 Chest pain, unspecified: Secondary | ICD-10-CM

## 2016-04-12 DIAGNOSIS — R9439 Abnormal result of other cardiovascular function study: Secondary | ICD-10-CM | POA: Insufficient documentation

## 2016-04-12 LAB — MYOCARDIAL PERFUSION IMAGING
CHL CUP MPHR: 156 {beats}/min
CHL CUP NUCLEAR SDS: 0
Estimated workload: 12.1 METS
Exercise duration (min): 10 min
Exercise duration (sec): 15 s
LHR: 0.29
LVDIAVOL: 79 mL (ref 46–106)
LVSYSVOL: 31 mL
Peak HR: 146 {beats}/min
Percent HR: 93 %
Rest HR: 52 {beats}/min
SRS: 2
SSS: 2
TID: 1.02

## 2016-04-12 MED ORDER — TECHNETIUM TC 99M TETROFOSMIN IV KIT
10.7000 | PACK | Freq: Once | INTRAVENOUS | Status: AC | PRN
  Administered 2016-04-12: 10.7 via INTRAVENOUS
  Filled 2016-04-12: qty 11

## 2016-04-12 MED ORDER — TECHNETIUM TC 99M TETROFOSMIN IV KIT
32.6000 | PACK | Freq: Once | INTRAVENOUS | Status: AC | PRN
  Administered 2016-04-12: 32.6 via INTRAVENOUS
  Filled 2016-04-12: qty 33

## 2016-04-12 NOTE — Telephone Encounter (Signed)
New Message:    Please call asap,this is concerning test she is having this morning please.

## 2016-04-12 NOTE — Telephone Encounter (Signed)
Spoke with patient to answer questions about myocardial perfusion test that is ordered. She wanted to know the difference between the lexiscan and the treadmill. I answered her questions to her satisfaction. She is concerned about how she is going to feel several hours after the test is over. She states she generally has symptoms of chest pain and possible neurological changes several hours after she exercises. I advised her that if this occurs today, to call back to our office for assistance. She verbalized understanding and agreement and thanked me for the call.

## 2016-04-13 ENCOUNTER — Encounter: Payer: Self-pay | Admitting: Nurse Practitioner

## 2016-04-13 ENCOUNTER — Other Ambulatory Visit: Payer: Self-pay | Admitting: Cardiovascular Disease

## 2016-04-13 ENCOUNTER — Telehealth: Payer: Self-pay | Admitting: Cardiovascular Disease

## 2016-04-13 DIAGNOSIS — R9439 Abnormal result of other cardiovascular function study: Secondary | ICD-10-CM

## 2016-04-13 MED ORDER — ASPIRIN EC 81 MG PO TBEC: 81.0000 mg | DELAYED_RELEASE_TABLET | Freq: Every day | ORAL | Status: DC

## 2016-04-13 NOTE — Telephone Encounter (Signed)
New message    Kaitlyn Faulkner is calling from ID and she said that she has put a note   on the referral pertaining to the Lime disease diagnosis

## 2016-04-13 NOTE — Telephone Encounter (Signed)
New Message     Please call she has some information about stress test she would like to speak to you about.  She is also having a lot of bleeding from her teeth and when they took her IV out yesterday she bled alot

## 2016-04-13 NOTE — Telephone Encounter (Signed)
Spoke with patient to review results of myocardial perfusion study. After discussion of options of having a cardiac CTA or heart catheterization, patient would like to proceed with cath. She is scheduled for cath on Friday 2/9 [facility] She is scheduled for lab appointment tomorrow, 2/8. I reviewed pre-procedure instructions with her and she verbalized understanding. She is aware these instructions are available on MyChart.  She is aware to start aspirin 81 mg daily and to take on day of procedure. I answered questions to her satisfaction and she thanked me for the call.

## 2016-04-13 NOTE — Telephone Encounter (Signed)
Patient has elected to proceed with cardiac cath rather than CT. I will cancel the order.

## 2016-04-14 ENCOUNTER — Other Ambulatory Visit: Payer: BLUE CROSS/BLUE SHIELD | Admitting: *Deleted

## 2016-04-14 DIAGNOSIS — R9439 Abnormal result of other cardiovascular function study: Secondary | ICD-10-CM

## 2016-04-15 ENCOUNTER — Encounter (HOSPITAL_COMMUNITY)
Admission: RE | Disposition: A | Discharge status: Home / Self Care | Payer: Self-pay | Source: Ambulatory Visit | Attending: Internal Medicine

## 2016-04-15 ENCOUNTER — Telehealth: Payer: Self-pay | Admitting: Cardiovascular Disease

## 2016-04-15 ENCOUNTER — Ambulatory Visit (HOSPITAL_COMMUNITY)
Admission: RE | Admit: 2016-04-15 | Discharge: 2016-04-15 | Disposition: A | Discharge status: Home / Self Care | Payer: BLUE CROSS/BLUE SHIELD | Source: Ambulatory Visit | Attending: Internal Medicine | Admitting: Internal Medicine

## 2016-04-15 ENCOUNTER — Encounter (HOSPITAL_COMMUNITY): Payer: Self-pay | Admitting: Internal Medicine

## 2016-04-15 DIAGNOSIS — Q245 Malformation of coronary vessels: Secondary | ICD-10-CM | POA: Diagnosis not present

## 2016-04-15 DIAGNOSIS — R0789 Other chest pain: Secondary | ICD-10-CM | POA: Diagnosis not present

## 2016-04-15 DIAGNOSIS — E869 Volume depletion, unspecified: Secondary | ICD-10-CM | POA: Insufficient documentation

## 2016-04-15 DIAGNOSIS — Z8249 Family history of ischemic heart disease and other diseases of the circulatory system: Secondary | ICD-10-CM | POA: Diagnosis not present

## 2016-04-15 DIAGNOSIS — R9439 Abnormal result of other cardiovascular function study: Secondary | ICD-10-CM | POA: Diagnosis present

## 2016-04-15 DIAGNOSIS — I2510 Atherosclerotic heart disease of native coronary artery without angina pectoris: Secondary | ICD-10-CM | POA: Diagnosis not present

## 2016-04-15 DIAGNOSIS — I959 Hypotension, unspecified: Secondary | ICD-10-CM | POA: Insufficient documentation

## 2016-04-15 DIAGNOSIS — A6920 Lyme disease, unspecified: Secondary | ICD-10-CM | POA: Insufficient documentation

## 2016-04-15 DIAGNOSIS — E039 Hypothyroidism, unspecified: Secondary | ICD-10-CM | POA: Insufficient documentation

## 2016-04-15 DIAGNOSIS — R079 Chest pain, unspecified: Secondary | ICD-10-CM | POA: Diagnosis present

## 2016-04-15 HISTORY — PX: LEFT HEART CATH AND CORONARY ANGIOGRAPHY: CATH118249

## 2016-04-15 LAB — CBC
HEMATOCRIT: 39.5 % (ref 34.0–46.6)
Hemoglobin: 13.1 g/dL (ref 11.1–15.9)
MCH: 30.9 pg (ref 26.6–33.0)
MCHC: 33.2 g/dL (ref 31.5–35.7)
MCV: 93 fL (ref 79–97)
PLATELETS: 209 10*3/uL (ref 150–379)
RBC: 4.24 x10E6/uL (ref 3.77–5.28)
RDW: 12.7 % (ref 12.3–15.4)
WBC: 4.9 10*3/uL (ref 3.4–10.8)

## 2016-04-15 LAB — BASIC METABOLIC PANEL
BUN / CREAT RATIO: 22 (ref 12–28)
BUN: 17 mg/dL (ref 8–27)
CHLORIDE: 100 mmol/L (ref 96–106)
CO2: 23 mmol/L (ref 18–29)
Calcium: 9.2 mg/dL (ref 8.7–10.3)
Creatinine, Ser: 0.79 mg/dL (ref 0.57–1.00)
GFR, EST AFRICAN AMERICAN: 91 mL/min/{1.73_m2} (ref >59)
GFR, EST NON AFRICAN AMERICAN: 79 mL/min/{1.73_m2} (ref >59)
Glucose: 90 mg/dL (ref 65–99)
POTASSIUM: 4.2 mmol/L (ref 3.5–5.2)
SODIUM: 141 mmol/L (ref 134–144)

## 2016-04-15 LAB — PROTIME-INR
INR: 1 (ref 0.8–1.2)
Prothrombin Time: 10.6 s (ref 9.1–12.0)

## 2016-04-15 SURGERY — LEFT HEART CATH AND CORONARY ANGIOGRAPHY
Anesthesia: LOCAL

## 2016-04-15 MED ORDER — SODIUM CHLORIDE 0.9% FLUSH: 3.0000 mL | INTRAVENOUS | Status: DC | PRN

## 2016-04-15 MED ORDER — SODIUM CHLORIDE 0.9% FLUSH: 3.0000 mL | Freq: Two times a day (BID) | INTRAVENOUS | Status: DC

## 2016-04-15 MED ORDER — NITROGLYCERIN 1 MG/10 ML FOR IR/CATH LAB
INTRA_ARTERIAL | Status: AC
Start: 2016-04-15 — End: ?
  Filled 2016-04-15: qty 10

## 2016-04-15 MED ORDER — SODIUM CHLORIDE 0.9 % IV SOLN: 250.0000 mL | INTRAVENOUS | Status: DC | PRN

## 2016-04-15 MED ORDER — IOPAMIDOL (ISOVUE-370) INJECTION 76%
INTRAVENOUS | Status: DC | PRN
  Administered 2016-04-15: 30 mL via INTRA_ARTERIAL

## 2016-04-15 MED ORDER — SODIUM CHLORIDE 0.9 % WEIGHT BASED INFUSION: 1.0000 mL/kg/h | INTRAVENOUS | Status: DC

## 2016-04-15 MED ORDER — ATORVASTATIN CALCIUM 40 MG PO TABS: 40.0000 mg | ORAL_TABLET | Freq: Every day | ORAL | 11 refills | Status: DC

## 2016-04-15 MED ORDER — SODIUM CHLORIDE 0.9 % WEIGHT BASED INFUSION
3.0000 mL/kg/h | INTRAVENOUS | Status: DC
  Administered 2016-04-15: 3 mL/kg/h via INTRAVENOUS

## 2016-04-15 MED ORDER — ASPIRIN 81 MG PO CHEW
81.0000 mg | CHEWABLE_TABLET | ORAL | Status: AC
  Administered 2016-04-15: 81 mg via ORAL

## 2016-04-15 MED ORDER — LIDOCAINE HCL (PF) 1 % IJ SOLN
INTRAMUSCULAR | Status: DC | PRN
  Administered 2016-04-15: 2 mL via INTRADERMAL

## 2016-04-15 MED ORDER — MIDAZOLAM HCL 2 MG/2ML IJ SOLN
INTRAMUSCULAR | Status: DC | PRN
  Administered 2016-04-15: 1 mg via INTRAVENOUS

## 2016-04-15 MED ORDER — VERAPAMIL HCL 2.5 MG/ML IV SOLN
INTRAVENOUS | Status: AC
  Filled 2016-04-15: qty 2

## 2016-04-15 MED ORDER — MIDAZOLAM HCL 2 MG/2ML IJ SOLN
INTRAMUSCULAR | Status: AC
  Filled 2016-04-15: qty 2

## 2016-04-15 MED ORDER — VERAPAMIL HCL 2.5 MG/ML IV SOLN
INTRAVENOUS | Status: DC | PRN
  Administered 2016-04-15: 10 mL via INTRA_ARTERIAL

## 2016-04-15 MED ORDER — FENTANYL CITRATE (PF) 100 MCG/2ML IJ SOLN
INTRAMUSCULAR | Status: DC | PRN
  Administered 2016-04-15: 25 ug via INTRAVENOUS

## 2016-04-15 MED ORDER — HEPARIN (PORCINE) IN NACL 2-0.9 UNIT/ML-% IJ SOLN
INTRAMUSCULAR | Status: DC | PRN
  Administered 2016-04-15: 1000 mL via INTRA_ARTERIAL

## 2016-04-15 MED ORDER — HEPARIN (PORCINE) IN NACL 2-0.9 UNIT/ML-% IJ SOLN
INTRAMUSCULAR | Status: AC
  Filled 2016-04-15: qty 1000

## 2016-04-15 MED ORDER — LIDOCAINE HCL (PF) 1 % IJ SOLN
INTRAMUSCULAR | Status: AC
  Filled 2016-04-15: qty 30

## 2016-04-15 MED ORDER — HEPARIN SODIUM (PORCINE) 1000 UNIT/ML IJ SOLN
INTRAMUSCULAR | Status: DC | PRN
  Administered 2016-04-15: 3000 [IU] via INTRAVENOUS

## 2016-04-15 MED ORDER — HEPARIN SODIUM (PORCINE) 1000 UNIT/ML IJ SOLN
INTRAMUSCULAR | Status: AC
  Filled 2016-04-15: qty 1

## 2016-04-15 MED ORDER — SODIUM CHLORIDE 0.9 % IV SOLN: INTRAVENOUS | Status: DC

## 2016-04-15 MED ORDER — FENTANYL CITRATE (PF) 100 MCG/2ML IJ SOLN
INTRAMUSCULAR | Status: AC
  Filled 2016-04-15: qty 2

## 2016-04-15 MED ORDER — ASPIRIN 81 MG PO CHEW
CHEWABLE_TABLET | ORAL | Status: AC
  Filled 2016-04-15: qty 1

## 2016-04-15 SURGICAL SUPPLY — 11 items
CATH EXPO 5F FL3.5 (CATHETERS) ×1 IMPLANT
CATH EXPO 5FR FR4 (CATHETERS) ×1 IMPLANT
DEVICE RAD COMP TR BAND LRG (VASCULAR PRODUCTS) ×1 IMPLANT
GLIDESHEATH SLEND SS 6F .021 (SHEATH) ×1 IMPLANT
GUIDEWIRE INQWIRE 1.5J.035X260 (WIRE) IMPLANT
INQWIRE 1.5J .035X260CM (WIRE) ×2
KIT HEART LEFT (KITS) ×2 IMPLANT
PACK CARDIAC CATHETERIZATION (CUSTOM PROCEDURE TRAY) ×2 IMPLANT
TRANSDUCER W/STOPCOCK (MISCELLANEOUS) ×2 IMPLANT
TUBING CIL FLEX 10 FLL-RA (TUBING) ×2 IMPLANT
WIRE HI TORQ VERSACORE-J 145CM (WIRE) ×1 IMPLANT

## 2016-04-15 NOTE — Interval H&P Note (Signed)
History and Physical Interval Note:  04/15/2016 7:12 AM  Kaitlyn DubonnetBarbara Faulkner  has presented today for cardiac catheterization, with the diagnosis of chest pain and abnormal stress test.  The various methods of treatment have been discussed with the patient and family. After consideration of risks, benefits and other options for treatment, the patient has consented to  Procedure(s): Left Heart Cath and Coronary Angiography (N/A) as a surgical intervention .  The patient's history has been reviewed, patient examined, no change in status, stable for surgery.  I have reviewed the patient's chart and labs.  Questions were answered to the patient's satisfaction.    Cath Lab Visit (complete for each Cath Lab visit)  Clinical Evaluation Leading to the Procedure:   ACS: No.  Non-ACS:    Anginal Classification: CCS III  Anti-ischemic medical therapy: Minimal Therapy (1 class of medications)  Non-Invasive Test Results: Low-risk stress test findings: cardiac mortality <1%/year  Prior CABG: No previous CABG   Kaitlyn Faulkner

## 2016-04-15 NOTE — H&P (View-Only) (Signed)
Cardiology Office Note   Date:  03/31/2016   ID:  Kaitlyn Faulkner, DOB 04/28/1950, MRN [id number]  PCP:  Jeanann Lewandowsky, MD  [name].  Cardiologist:   Kristeen Miss, MD   Chief Complaint  Patient presents with  . Chest Pain   Problem List 1. Chest pain  2. Lyme disease  3. Palpitations    History of Present Illness: Kaitlyn Faulkner is a 66 y.o. female who presents for eval of chest pain  Has had Lyme disease - diagnosed with Lyme disease 2 years ago .   Is having some neuro issues  Has also been having chest pain issues Feels like "something is taking over her body" Ringing in her ears,   Gets sweats,  Nauseated,  Pressure in left side of neck, left arm pain  Can last several hours Had an episode last night  Has several episodes each week  Is not associated with standing  Occasionally is brought on my eating   Has had a stress test 2 years ago, ECGs always look fine. Has been seen in the ER several times and nothing has shown up Has had an echo  Has been to a medical intuitive doctor - who thought that she had some pulmonary issues.    Father had heart disease in his 66   Has Lyme disease .  She did not complete the course of Abx.  Caused stomach issues Is considering getting Ozone treatments at [facility]   Is vegetarian, has been losing weight  Eats 2 pea protein smoothies a day .  Not much other protein . Maybe 1/2 an egg a week.    Past Medical History:  Diagnosis Date  . Hair loss   . Hypothyroid   . Lyme disease   . Palpitations     Past Surgical History:  Procedure Laterality Date  . DILATION AND CURETTAGE OF UTERUS  1971     Current Outpatient Prescriptions  Medication Sig Dispense Refill  . b complex vitamins tablet Take 1 tablet by mouth 2 (two) times daily.    . Fish Oil OIL Take 5 mLs by mouth daily.    . metoprolol succinate (TOPROL-XL) 25 MG 24 hr tablet Take 25 mg by mouth daily.  2  . Misc Natural Products (PROGESTERONE EX) Apply  topically 2 (two) times daily.    Marland Kitchen PRESCRIPTION MEDICATION Apply 1 application topically 2 (two) times daily. Estrogen cream. Pharmacy listed says the did not fill this    . thyroid (ARMOUR) 65 MG tablet Take 65 mg by mouth daily.     No current facility-administered medications for this visit.     Allergies:   Patient has no known allergies.    Social History:  The patient  reports that she has never smoked. She has never used smokeless tobacco. She reports that she does not drink alcohol or use drugs.   Family History:  The patient's family history includes CAD in her father; Parkinson's disease in her mother.    ROS:  Please see the history of present illness.    Review of Systems: Constitutional:  denies fever, chills, diaphoresis, appetite change and fatigue.  HEENT: denies photophobia, eye pain, redness, hearing loss, ear pain, congestion, sore throat, rhinorrhea, sneezing, neck pain, neck stiffness and tinnitus.  Respiratory: denies SOB, DOE, cough, chest tightness, and wheezing.  Cardiovascular: denies chest pain, palpitations and leg swelling.  Gastrointestinal: denies nausea, vomiting, abdominal pain, diarrhea, constipation, blood in stool.  Genitourinary: denies dysuria, urgency, frequency,  hematuria, flank pain and difficulty urinating.  Musculoskeletal: denies  myalgias, back pain, joint swelling, arthralgias and gait problem.   Skin: denies pallor, rash and wound.  Neurological: denies dizziness, seizures, syncope, weakness, light-headedness, numbness and headaches.   Hematological: denies adenopathy, easy bruising, personal or family bleeding history.  Psychiatric/ Behavioral: denies suicidal ideation, mood changes, confusion, nervousness, sleep disturbance and agitation.       All other systems are reviewed and negative.    PHYSICAL EXAM: VS:  BP 90/62 (BP Location: Right Arm, Patient Position: Sitting, Cuff Size: Normal)   Pulse (!) 56   Ht 5\' 9"  (1.753 m)    Wt 133 lb 12.8 oz (60.7 kg)   SpO2 99%   BMI 19.76 kg/m  , BMI Body mass index is 19.76 kg/m. GEN: Well nourished, well developed, in no acute distress  HEENT: normal  Neck: no JVD, carotid bruits, or masses Cardiac: RRR; no murmurs, rubs, or gallops,no edema  Respiratory:  clear to auscultation bilaterally, normal work of breathing GI: soft, nontender, nondistended, + BS MS: no deformity or atrophy  Skin: warm and dry, no rash Neuro:  Strength and sensation are intact Psych: normal   EKG:  EKG is not ordered today.    Recent Labs: 07/25/2015: BUN 20; Creatinine, Ser 0.79; Hemoglobin 13.1; Platelets 184; Potassium 4.1; Sodium 140    Lipid Panel No results found for: CHOL, TRIG, HDL, CHOLHDL, VLDL, LDLCALC, LDLDIRECT    Wt Readings from Last 3 Encounters:  03/31/16 133 lb 12.8 oz (60.7 kg)  11/22/15 138 lb (62.6 kg)  01/06/09 133 lb 2.1 oz (60.4 kg)      Other studies Reviewed: Additional studies/ records that were reviewed today include: . Review of the above records demonstrates:    ASSESSMENT AND PLAN:  1.  Hypotension:   I suspect that her symptoms are related to hypotension - lightheadedness, ringing in ears, CP, She has a low BP at baseline Has had a normal echo at Dr. Annitta JerseyHArwani's office several years ago and a normal stress nuclear study several years ago   Her symptoms do not sound like coronary ischemia to me.    I think she is volume depleted because of her insufficient intake of protein and solutes.  I've recommended that she greatly increase her intake of sodium potassium and protein.    She cannot tolerate V-8 ( too acidic )  Will get sea salt , use liberally Increase intake of eggs, peanut butter, yogart, . Will call us in a week or so to report how she is feeling.  We can get further studies if she is not improving   2. Chronic lyme disease:    Plans per [facility].   Would consider referral to ID if she has additional issues  3 CP   :    Does not sound cardiac  Will cancel the myoview for Monday .   Will reschedule if she develops symptoms that sound like angina   Current medicines are reviewed at length with the patient today.  The patient does not have concerns regarding medicines.  Labs/ tests ordered today include:  No orders of the defined types were placed in this encounter.    Disposition:   FU with me in 6 weeks  She will call Marcelino DusterMichelle next week to let us know how she is doing.      Kristeen MissPhilip Candee Hoon, MD  03/31/2016 12:17 PM    [facility] [address],  [place], [place]  [address] Phone: [phone]; Fax: [phone]   Addendum:  Celine Ahr shows a subtile anterior and apical defect. She is quite thin and this is unlikely to be breast artifact. She continues to have significant symptoms. We discussed CT angiogram and cath Given her symptoms , I think the cath will be better at really giving [place] an answer with her coronaries.  I've explained the risks, benefits, options of cardiac cath. She understands and agrees to proceed.

## 2016-04-15 NOTE — Discharge Instructions (Signed)

## 2016-04-15 NOTE — Telephone Encounter (Signed)
Patient called and stated that she had a cath done today at 11:30 through her radial artery. She states that she was feeling lightheaded earlier, but is not anymore. She states that her fingers are tingling and her hand feels a little colder than the other. The patient still had her bandage on, so I asked her to remove it and check to see if she had a pulse. The patient states that she can feel her pulse. She states that her fingers are not discolored and that she does not have any bleeding or swelling at the site. I explained to her that unless she was having swelling, bleeding, discoloration of her fingers, if her hand was a lot colder than the other, or if she could not feel her pulse then that would need attention. I advised the patient to continue to monitor the site and that she would need to be seen if she developed those symptoms. The patient is still very anxious and is asking if she should be seen. I explained to Dr. Katrinka BlazingSmith her symptoms and he was in agreement that the patient should continue to monitor. I explained to the patient Dr. Michaelle CopasSmith's recommendations. I explained to her that if she developed swelling, bleeding, discoloration of her fingers, if her hand, if her hand was a lot colder than the other, or if she could not feel her pulse she should go to urgent care or the ED and she verbalized understanding.

## 2016-04-15 NOTE — Telephone Encounter (Signed)
Pt calling back

## 2016-04-15 NOTE — Telephone Encounter (Signed)
Kaitlyn Faulkner is calling because she had a cath put in on today and is feeling light-headed. She also experiencing a tingling sensation in her right hand and states that she was advised to call if she started to feel any of these symptoms. The symptoms started about 5 mins before the start of the call at 3:24. Please call, thanks.

## 2016-04-18 MED FILL — Nitroglycerin IV Soln 100 MCG/ML in D5W: INTRA_ARTERIAL | Qty: 10 | Status: AC

## 2016-04-19 NOTE — Telephone Encounter (Signed)
Agree with note by from Eligha BridegroomMichelle Swinyer,  RN .

## 2016-04-22 ENCOUNTER — Emergency Department (HOSPITAL_COMMUNITY)
Admission: EM | Admit: 2016-04-22 | Discharge: 2016-04-22 | Disposition: A | Discharge status: Home / Self Care | Payer: BLUE CROSS/BLUE SHIELD | Attending: Emergency Medicine | Admitting: Emergency Medicine

## 2016-04-22 ENCOUNTER — Emergency Department (HOSPITAL_COMMUNITY): Payer: BLUE CROSS/BLUE SHIELD

## 2016-04-22 ENCOUNTER — Other Ambulatory Visit: Payer: Self-pay

## 2016-04-22 DIAGNOSIS — R471 Dysarthria and anarthria: Secondary | ICD-10-CM | POA: Diagnosis not present

## 2016-04-22 DIAGNOSIS — Z5181 Encounter for therapeutic drug level monitoring: Secondary | ICD-10-CM | POA: Insufficient documentation

## 2016-04-22 DIAGNOSIS — G459 Transient cerebral ischemic attack, unspecified: Secondary | ICD-10-CM | POA: Diagnosis not present

## 2016-04-22 DIAGNOSIS — R200 Anesthesia of skin: Secondary | ICD-10-CM | POA: Diagnosis present

## 2016-04-22 DIAGNOSIS — Z79899 Other long term (current) drug therapy: Secondary | ICD-10-CM | POA: Insufficient documentation

## 2016-04-22 DIAGNOSIS — Z7982 Long term (current) use of aspirin: Secondary | ICD-10-CM | POA: Insufficient documentation

## 2016-04-22 DIAGNOSIS — E039 Hypothyroidism, unspecified: Secondary | ICD-10-CM | POA: Diagnosis not present

## 2016-04-22 LAB — I-STAT CHEM 8, ED
BUN: 19 mg/dL (ref 6–20)
CALCIUM ION: 1.08 mmol/L — AB (ref 1.15–1.40)
CHLORIDE: 100 mmol/L — AB (ref 101–111)
CREATININE: 0.7 mg/dL (ref 0.44–1.00)
GLUCOSE: 104 mg/dL — AB (ref 65–99)
HCT: 43 % (ref 36.0–46.0)
Hemoglobin: 14.6 g/dL (ref 12.0–15.0)
Potassium: 3.9 mmol/L (ref 3.5–5.1)
Sodium: 137 mmol/L (ref 135–145)
TCO2: 26 mmol/L (ref 0–100)

## 2016-04-22 LAB — COMPREHENSIVE METABOLIC PANEL
ALK PHOS: 47 U/L (ref 38–126)
ALT: 14 U/L (ref 14–54)
ANION GAP: 12 (ref 5–15)
AST: 22 U/L (ref 15–41)
Albumin: 4.7 g/dL (ref 3.5–5.0)
BILIRUBIN TOTAL: 1.1 mg/dL (ref 0.3–1.2)
BUN: 16 mg/dL (ref 6–20)
CALCIUM: 9.6 mg/dL (ref 8.9–10.3)
CO2: 25 mmol/L (ref 22–32)
Chloride: 100 mmol/L — ABNORMAL LOW (ref 101–111)
Creatinine, Ser: 0.78 mg/dL (ref 0.44–1.00)
GFR calc non Af Amer: >60 mL/min (ref >60)
GLUCOSE: 101 mg/dL — AB (ref 65–99)
Potassium: 4.1 mmol/L (ref 3.5–5.1)
Sodium: 137 mmol/L (ref 135–145)
TOTAL PROTEIN: 7.8 g/dL (ref 6.5–8.1)

## 2016-04-22 LAB — DIFFERENTIAL
Basophils Absolute: 0 10*3/uL (ref 0.0–0.1)
Basophils Relative: 1 %
EOS PCT: 1 %
Eosinophils Absolute: 0.1 10*3/uL (ref 0.0–0.7)
LYMPHS ABS: 1.5 10*3/uL (ref 0.7–4.0)
LYMPHS PCT: 19 %
MONO ABS: 0.7 10*3/uL (ref 0.1–1.0)
Monocytes Relative: 9 %
Neutro Abs: 5.4 10*3/uL (ref 1.7–7.7)
Neutrophils Relative %: 70 %

## 2016-04-22 LAB — CBC
HCT: 40.7 % (ref 36.0–46.0)
HEMOGLOBIN: 14.1 g/dL (ref 12.0–15.0)
MCH: 31.1 pg (ref 26.0–34.0)
MCHC: 34.6 g/dL (ref 30.0–36.0)
MCV: 89.8 fL (ref 78.0–100.0)
Platelets: 205 10*3/uL (ref 150–400)
RBC: 4.53 MIL/uL (ref 3.87–5.11)
RDW: 11.8 % (ref 11.5–15.5)
WBC: 7.7 10*3/uL (ref 4.0–10.5)

## 2016-04-22 LAB — PROTIME-INR
INR: 0.94
PROTHROMBIN TIME: 12.6 s (ref 11.4–15.2)

## 2016-04-22 LAB — I-STAT TROPONIN, ED: Troponin i, poc: 0 ng/mL (ref 0.00–0.08)

## 2016-04-22 LAB — APTT: aPTT: 30 seconds (ref 24–36)

## 2016-04-22 LAB — CBG MONITORING, ED: GLUCOSE-CAPILLARY: 105 mg/dL — AB (ref 65–99)

## 2016-04-22 MED ORDER — LORAZEPAM 2 MG/ML IJ SOLN
1.0000 mg | Freq: Once | INTRAMUSCULAR | Status: AC
  Administered 2016-04-22: 1 mg via INTRAVENOUS
  Filled 2016-04-22: qty 1

## 2016-04-22 NOTE — Consult Note (Signed)
Requesting Physician: Code Stroke    Chief Complaint: slurred speech, right face/arm numbness/tingling  History obtained from:  Patient   HPI:                                                                                                                                         Kaitlyn Faulkner is an 66 y.o. female who presented to the ED today with slurred speech and right face/arm numbness/tingling. This morning, she was eating breakfast and reading her newspaper when she felt her thoughts were jumbled. She walked into the ED when code stroke was initiated for her symptoms. 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 She follows with integrative medicine in [facility]. She takes atorvastatin after being found to have some blockages in her heart from a cardiac cath last week. Otherwise, she denies any focal weakness, dizziness, changes in vision.   Date last known well: Date: 04/22/2016 Time last known well: Time: 09:00          Past Medical History:  Diagnosis Date  . Hair loss   . Hypothyroid   . Lyme disease   . Palpitations     Past Surgical History:  Procedure Laterality Date  . DILATION AND CURETTAGE OF UTERUS  1971  . LEFT HEART CATH AND CORONARY ANGIOGRAPHY N/A 04/15/2016   Procedure: Left Heart Cath and Coronary Angiography;  Surgeon: Yvonne Kendall, MD;  Location: [facility] INVASIVE CV LAB;  Service: Cardiovascular;  Laterality: N/A;    Family History  Problem Relation Age of Onset  . Parkinson's disease Mother   . CAD Father    Social History:  reports that she has never smoked. She has never used smokeless tobacco. She reports that she does not drink alcohol or use drugs.  Allergies:  Allergies  Allergen Reactions  . Gluten Meal     Pt preference   . Lactose Intolerance (Gi)     Pt preference     Medications:                                                                                                                            No current facility-administered medications for this encounter.    Current Outpatient Prescriptions  Medication Sig Dispense Refill  . aspirin 325 MG tablet Take 325 mg by mouth daily  as needed for moderate pain or headache.    Marland Kitchen. aspirin EC 81 MG tablet Take 1 tablet (81 mg total) by mouth daily. (Patient not taking: Reported on 04/13/2016)    . atorvastatin (LIPITOR) 40 MG tablet Take 1 tablet (40 mg total) by mouth daily. 30 tablet 11  . Cholecalciferol (VITAMIN D3) 5000 units CAPS Take 5,000 Units by mouth daily.    . Digestive Enzyme CAPS Take 1 capsule by mouth 2 (two) times daily.    . Omega-3 Fatty Acids (FISH OIL) 1000 MG CAPS Take 2,000 mg by mouth daily.    Marland Kitchen. PRESCRIPTION MEDICATION Estradiol 2mg  capsule, take 2mg s daily for 3 weeks on 1 week off    . PRESCRIPTION MEDICATION Progesterone 300mg s capsule, take 300mg s daily for 3 weeks off 1 week.    . propranolol (INDERAL) 10 MG tablet Take 1 tablet (10 mg total) by mouth 4 (four) times daily as needed. (Patient taking differently: Take 10 mg by mouth 4 (four) times daily as needed (irregular heartbeat). ) 60 tablet 11  . vitamin E 400 UNIT capsule Take 400 Units by mouth 2 (two) times daily.       ROS:                                                                                                                                       History obtained from the patient  Neurologic Examination:                                                                                                      Blood pressure 123/58, pulse 64, SpO2 100 %.  HEENT-  Normocephalic, no lesions, without obvious abnormality.  Normal external eye and conjunctiva. Normal external ears. Normal external nose, mucus membranes and septum.  Normal pharynx. Extremities- no joint deformities, effusion, or inflammation Musculoskeletal-no joint tenderness, deformity or swelling Skin-warm and dry, no hyperpigmentation, vitiligo, or suspicious lesions  Neurological  Examination Mental Status: Alert, oriented, thought content appropriate.  Speech fluent without evidence of aphasia.  Able to follow 3 step commands without difficulty. Cranial Nerves: II: Visual fields grossly normal,  III,IV, VI: ptosis not present, extra-ocular motions intact bilaterally, pupils equal, round, reactive to light and accommodation V,VII: smile symmetric, facial light touch sensation normal bilaterally VIII: hearing normal bilaterally IX,X: uvula rises symmetrically XI: bilateral shoulder shrug XII: midline tongue extension Motor: Right : Upper extremity   5/5    Left:     Upper extremity  5/5  Lower extremity   5/5     Lower extremity   5/5 Tone and bulk:normal tone throughout; no atrophy noted Sensory: Light touch intact throughout, bilaterally Deep Tendon Reflexes: 2+ and symmetric throughout Plantars: Right: downgoing   Left: downgoing Cerebellar: normal finger-to-nose Gait: deferred       Lab Results: Basic Metabolic Panel:  Recent Labs Lab 04/22/16 1031  NA 137  K 3.9  CL 100*  GLUCOSE 104*  BUN 19  CREATININE 0.70    CBC:  Recent Labs Lab 04/22/16 1025 04/22/16 1031  WBC 7.7  --   NEUTROABS 5.4  --   HGB 14.1 14.6  HCT 40.7 43.0  MCV 89.8  --   PLT 205  --     CBG:  Recent Labs Lab 04/22/16 1034  GLUCAP 105*    Imaging: Ct Head Code Stroke W/o Cm  Result Date: 04/22/2016 CLINICAL DATA:  Code stroke.  Right-sided numbness.  Slurred speech. EXAM: CT HEAD WITHOUT CONTRAST TECHNIQUE: Contiguous axial images were obtained from the base of the skull through the vertex without intravenous contrast. COMPARISON:  None. FINDINGS: Brain: No evidence of acute infarction, hemorrhage, hydrocephalus, extra-axial collection or mass lesion/mass effect. Vascular: No hyperdense vessel or unexpected calcification. Skull: Negative Sinuses/Orbits: Negative Other: None ASPECTS (Alberta Stroke Program Early CT Score) - Ganglionic level infarction  (caudate, lentiform nuclei, internal capsule, insula, M1-M3 cortex): 7 - Supraganglionic infarction (M4-M6 cortex): 3 Total score (0-10 with 10 being normal): 10 IMPRESSION: 1. No acute intracranial abnormality. 2. ASPECTS is 10 These results were called by telephone at the time of interpretation on 04/22/2016 at 10:19 am to Dr. Benedict Needy, who verbally acknowledged these results. Electronically Signed   By: Marlan Palau M.D.   On: 04/22/2016 10:19    Assessment: Kaitlyn Faulkner is an 66 y.o. female who presented to the ED today with slurred speech and right face/arm numbness/tingling that have resolved appear suggestive of TIA. Head CT without findings of an acute infarct. -Order MRI to exclude ischemic changes and workup per primary team  Heywood Iles, PGY3 Internal Medicine Pager: [phone]  04/22/16 11:11 AM

## 2016-04-22 NOTE — Discharge Instructions (Signed)
Today's workup without any acute findings. CT of head negative MRI of head negative. Return for any new or worse symptoms. Make an appointment with neurology.

## 2016-04-22 NOTE — Code Documentation (Signed)
66 [facility] with complaints of right arm and right facial numbness with slurred speech.  Code stroke was called and she was taken to the CT scanner after airway clearance by ED MD.  In CT her symptoms are resolving.  Dr. Benedict NeedyNath present.  Her speech is clear - she has some mild numbness - first reported on right side - then after further exam she endorsed left leg and arm difference with pin prick with left being less "scratchy".  NIHHS 01.  She will remain in the tPA window until 1330.  Handoff to [facility] RN.  To call as needed.

## 2016-04-22 NOTE — ED Provider Notes (Signed)
[facility]EM[facility]Y DEPT Provider Note   CSN: [id number] Arrival date & time: 04/22/16  [id number]   An emergency department physician performed an initial assessment on this suspected stroke patient at 1000.  History   Chief Complaint Chief Complaint  Patient presents with  . Code Stroke    HPI Kaitlyn Faulkner is a 66 y.o. female.  Patient arrived as a code stroke. Patient with acute onset of right arm and right facial numbness and some difficulty with speech at 9:00 this morning. Right ear at 10 AM. Patient last evening stated she had a fullness in her throat with some difficulty swallowing. The patient now states that symptoms have resolved. Patient without prior history of stroke. 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      Past Medical History:  Diagnosis Date  . Hair loss   . Hypothyroid   . Lyme disease   . Palpitations     Patient Active Problem List   Diagnosis Date Noted  . Chest pain 04/15/2016  . Abnormal stress test 04/15/2016  . Orthostatic hypotension 03/31/2016  . DYSPEPSIA 01/06/2009    Past Surgical History:  Procedure Laterality Date  . DILATION AND CURETTAGE OF UTERUS  1971  . LEFT HEART CATH AND CORONARY ANGIOGRAPHY N/A 04/15/2016   Procedure: Left Heart Cath and Coronary Angiography;  Surgeon: Yvonne Kendallhristopher End, MD;  [facility];  Service: Cardiovascular;  Laterality: N/A;    OB History    No data available       Home Medications    Prior to Admission medications   Medication Sig Start Date End Date Taking? Authorizing Provider  aspirin 325 MG tablet Take 325 mg by mouth daily as needed for moderate pain or headache.   Yes Historical Provider, MD  Cholecalciferol (VITAMIN D3) 5000 units CAPS Take 5,000 Units by mouth daily.   Yes Historical Provider, MD  Digestive Enzyme CAPS Take 1 capsule by mouth 2 (two) times daily.   Yes Historical Provider, MD  Omega-3 Fatty Acids (FISH OIL) 1000 MG CAPS  Take 2,000 mg by mouth daily.   Yes Historical Provider, MD  PRESCRIPTION MEDICATION Estradiol 2mg  capsule, take 2mg s daily for 3 weeks on 1 week off   Yes Historical Provider, MD  PRESCRIPTION MEDICATION Progesterone 300mg s capsule, take 300mg s daily for 3 weeks off 1 week.   Yes Historical Provider, MD  propranolol (INDERAL) 10 MG tablet Take 1 tablet (10 mg total) by mouth 4 (four) times daily as needed. Patient taking differently: Take 10 mg by mouth 4 (four) times daily as needed (irregular heartbeat).  04/04/16  Yes Vesta MixerPhilip J Nahser, MD  vitamin E 400 UNIT capsule Take 400 Units by mouth 2 (two) times daily.   Yes Historical Provider, MD  aspirin EC 81 MG tablet Take 1 tablet (81 mg total) by mouth daily. Patient not taking: Reported on 04/13/2016 04/13/16   Vesta MixerPhilip J Nahser, MD  atorvastatin (LIPITOR) 40 MG tablet Take 1 tablet (40 mg total) by mouth daily. 04/15/16   Yvonne Kendallhristopher End, MD    Family History Family History  Problem Relation Age of Onset  . Parkinson's disease Mother   . CAD Father     Social History Social History  Substance Use Topics  . Smoking status: Never Smoker  . Smokeless tobacco: Never Used  . Alcohol use No     Comment: quit at age 66     Allergies   Gluten meal and Lactose intolerance (  gi)   Review of Systems Review of Systems  Constitutional: Negative for fever.  HENT: Positive for trouble swallowing. Negative for congestion.   Eyes: Negative for visual disturbance.  Respiratory: Negative for shortness of breath.   Cardiovascular: Negative for chest pain.  Gastrointestinal: Negative for abdominal pain.  Genitourinary: Negative for dysuria.  Musculoskeletal: Negative for back pain.  Skin: Negative for rash.  Neurological: Positive for speech difficulty and numbness. Negative for dizziness, seizures, syncope, facial asymmetry, weakness and headaches.  Hematological: Does not bruise/bleed easily.  Psychiatric/Behavioral: Negative for confusion.      Physical Exam Updated Vital Signs BP 103/55   Pulse 62   Resp 18   SpO2 100%   Physical Exam  Constitutional: She is oriented to person, place, and time. She appears well-nourished. No distress.  HENT:  Head: Normocephalic and atraumatic.  Mouth/Throat: Oropharynx is clear and moist.  Eyes: EOM are normal. Pupils are equal, round, and reactive to light.  Neck: Normal range of motion. Neck supple.  Cardiovascular: Normal rate, regular rhythm and normal heart sounds.   Pulmonary/Chest: Effort normal and breath sounds normal. No respiratory distress.  Abdominal: Soft. Bowel sounds are normal. There is no tenderness.  Musculoskeletal: Normal range of motion. She exhibits no edema.  Neurological: She is alert and oriented to person, place, and time. No cranial nerve deficit or sensory deficit. She exhibits normal muscle tone. Coordination normal.  Skin: Skin is warm. No erythema.  Nursing note and vitals reviewed.    ED Treatments / Results  Labs (all labs ordered are listed, but only abnormal results are displayed) Labs Reviewed  COMPREHENSIVE METABOLIC PANEL - Abnormal; Notable for the following:       Result Value   Chloride 100 (*)    Glucose, Bld 101 (*)    All other components within normal limits  CBG MONITORING, ED - Abnormal; Notable for the following:    Glucose-Capillary 105 (*)    All other components within normal limits  I-STAT CHEM 8, ED - Abnormal; Notable for the following:    Chloride 100 (*)    Glucose, Bld 104 (*)    Calcium, Ion 1.08 (*)    All other components within normal limits  PROTIME-INR  APTT  CBC  DIFFERENTIAL  I-STAT TROPOININ, ED    EKG  EKG Interpretation  Date/Time:  Friday April 22 2016 10:40:22 EST Ventricular Rate:  65 PR Interval:    QRS Duration: 96 QT Interval:  395 QTC Calculation: 411 R Axis:   80 Text Interpretation:  Sinus rhythm Anteroseptal infarct, age indeterminate Confirmed by Vennela Jutte  MD, Keano Guggenheim [phone])  on 04/22/2016 11:27:59 AM       Radiology Mr Brain Wo Contrast (neuro Protocol)  Result Date: 04/22/2016 CLINICAL DATA:  Code stroke.  Right-sided numbness.  Slurred speech. EXAM: MRI HEAD WITHOUT CONTRAST TECHNIQUE: Multiplanar, multiecho pulse sequences of the brain and surrounding structures were obtained without intravenous contrast. COMPARISON:  Head CT earlier same day FINDINGS: Brain: The brain has normal appearance without evidence of malformation, atrophy, old or acute small or large vessel infarction, hemorrhage, hydrocephalus or extra-axial collection. No pituitary abnormality. Vascular: Major vessels at the base of the brain show flow. Skull and upper cervical spine: Normal Sinuses/Orbits: Clear/ normal. Other: None significant. IMPRESSION: Normal examination. Electronically Signed   By: Paulina Fusi M.D.   On: 04/22/2016 13:43   Ct Head Code Stroke W/o Cm  Result Date: 04/22/2016 CLINICAL DATA:  Code stroke.  Right-sided numbness.  Slurred  speech. EXAM: CT HEAD WITHOUT CONTRAST TECHNIQUE: Contiguous axial images were obtained from the base of the skull through the vertex without intravenous contrast. COMPARISON:  None. FINDINGS: Brain: No evidence of acute infarction, hemorrhage, hydrocephalus, extra-axial collection or mass lesion/mass effect. Vascular: No hyperdense vessel or unexpected calcification. Skull: Negative Sinuses/Orbits: Negative Other: None ASPECTS (Alberta Stroke Program Early CT Score) - Ganglionic level infarction (caudate, lentiform nuclei, internal capsule, insula, M1-M3 cortex): 7 - Supraganglionic infarction (M4-M6 cortex): 3 Total score (0-10 with 10 being normal): 10 IMPRESSION: 1. No acute intracranial abnormality. 2. ASPECTS is 10 These results were called by telephone at the time of interpretation on 04/22/2016 at 10:19 am to Dr. Benedict Needy, who verbally acknowledged these results. Electronically Signed   By: Marlan Palau M.D.   On: 04/22/2016 10:19     Procedures Procedures (including critical care time)  Medications Ordered in ED Medications  LORazepam (ATIVAN) injection 1 mg (1 mg Intravenous Given 04/22/16 1233)     Initial Impression / Assessment and Plan / ED Course  I have reviewed the triage vital signs and the nursing notes.  Pertinent labs & imaging results that were available during my care of the patient were reviewed by me and considered in my medical decision making (see chart for details).     Patient brought in by neighbor for right arm right facial numbness and some difficulty with speech today at around 9 AM. Symptoms now resolved. Code stroke called upon arrival at 10 AM. Evaluated by stroke team neurology. And myself. There were not impressed by the symptoms head CT was negative. Recommended MRI. MRI also negative. Symptoms possibly could be related to a TIA but may not be. Certainly no significant motor things. In addition patient stated that she had a little bit of difficulty swallowing last evening. Patient also has a history of Lyme's disease. Possibly could be some neurological changes related to that.  Patient stable for discharge home follow-up with neurology as an outpatient. Patient will return for any new or worse symptoms.  Patient's code stroke was canceled shortly after arrival.  Final Clinical Impressions(s) / ED Diagnoses   Final diagnoses:  Numbness  Dysarthria    New Prescriptions New Prescriptions   No medications on file     Vanetta Mulders, MD 04/22/16 1556

## 2016-04-22 NOTE — ED Notes (Signed)
Pt ambulatory to restroom

## 2016-04-22 NOTE — ED Notes (Addendum)
Pt presents to ED c/o new onset R arm & R facial numbness & slurred speech today at 8:50-9:00, Code stroke called at [facility], this RN accompanied pt to have airway cleared @ 10:00 & pt arrived to CT scanner @ 10:02, this RN stayed with pt until primary RN arrived to CT

## 2016-04-22 NOTE — ED Notes (Signed)
Patient transported to MRI 

## 2016-05-02 ENCOUNTER — Telehealth: Payer: Self-pay | Admitting: Cardiovascular Disease

## 2016-05-02 NOTE — Telephone Encounter (Signed)
Left message for patient to call back after 8 am tomorrow 

## 2016-05-02 NOTE — Telephone Encounter (Signed)
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 She states that it is a virus in her heart and would like to discuss what treatment options are available. Please call, thanks.

## 2016-05-06 ENCOUNTER — Encounter: Payer: Self-pay | Admitting: Cardiovascular Disease

## 2016-05-15 NOTE — Progress Notes (Signed)
Cardiology Office Note   Date:  05/15/2016   ID:  Kaitlyn Faulkner, DOB Aug 04, 1950, MRN [id number]  PCP:  Jeanann Lewandowsky, MD  [name].  Cardiologist:   Kristeen Miss, MD   No chief complaint on file.  Problem List 1. Chest pain  2. Lyme disease  3. Palpitations    History of Present Illness: Kaitlyn Faulkner is a 66 y.o. female who presents for eval of chest pain  Has had Lyme disease - diagnosed with Lyme disease 2 years ago .   Is having some neuro issues  Has also been having chest pain issues Feels like "something is taking over her body" Ringing in her ears,   Gets sweats,  Nauseated,  Pressure in left side of neck, left arm pain  Can last several hours Had an episode last night  Has several episodes each week  Is not associated with standing  Occasionally is brought on my eating   Has had a stress test 2 years ago, ECGs always look fine. Has been seen in the ER several times and nothing has shown up Has had an echo  Has been to a medical intuitive doctor - who thought that she had some pulmonary issues.    Father had heart disease in his 74   Has Lyme disease .  She did not complete the course of Abx.  Caused stomach issues Is considering getting Ozone treatments at [facility]   Is vegetarian, has been losing weight  Eats 2 pea protein smoothies a day .  Not much other protein . Maybe 1/2 an egg a week.   May 16, 2016:  Kaitlyn Faulkner is seen today . We scheduled a myoview after her last visit myoview shows a subtile anterior and apical defect. She is quite thin and this is unlikely to be breast artifact. She continues to have significant symptoms.  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   Medical therapy was advised.   Has had a rough winter according to her She was having ? stroke symptoms on Feb. 16 and went to the ER.   Had some dissorientation.   Right arm numbness Had CT scan looked good  She thinks it could be  Lyme disease.    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     Past Medical History:  Diagnosis Date  . Hair loss   . Hypothyroid   . Lyme disease   . Palpitations     Past Surgical History:  Procedure Laterality Date  . DILATION AND CURETTAGE OF UTERUS  1971  . LEFT HEART CATH AND CORONARY ANGIOGRAPHY N/A 04/15/2016   Procedure: Left Heart Cath and Coronary Angiography;  Surgeon: Yvonne Kendall, MD;  Location: [facility] INVASIVE CV LAB;  Service: Cardiovascular;  Laterality: N/A;     Current Outpatient Prescriptions  Medication Sig Dispense Refill  . aspirin 325 MG tablet Take 325 mg by mouth daily as needed for moderate pain or headache.    Marland Kitchen aspirin EC 81 MG tablet Take 1 tablet (81 mg total) by mouth daily. (Patient not taking: Reported on 04/13/2016)    . atorvastatin (LIPITOR) 40 MG tablet Take 1 tablet (40 mg total) by mouth daily. 30 tablet 11  . Cholecalciferol (VITAMIN D3) 5000 units CAPS Take 5,000 Units by mouth daily.    . Digestive Enzyme CAPS Take 1 capsule by mouth 2 (two) times daily.    . Omega-3  Fatty Acids (FISH OIL) 1000 MG CAPS Take 2,000 mg by mouth daily.    Marland Kitchen PRESCRIPTION MEDICATION Estradiol 2mg  capsule, take 2mg s daily for 3 weeks on 1 week off    . PRESCRIPTION MEDICATION Progesterone 300mg s capsule, take 300mg s daily for 3 weeks off 1 week.    . propranolol (INDERAL) 10 MG tablet Take 1 tablet (10 mg total) by mouth 4 (four) times daily as needed. (Patient taking differently: Take 10 mg by mouth 4 (four) times daily as needed (irregular heartbeat). ) 60 tablet 11  . vitamin E 400 UNIT capsule Take 400 Units by mouth 2 (two) times daily.     No current facility-administered medications for this visit.     Allergies:   Gluten meal and Lactose intolerance (gi)    Social History:  The patient  reports that she has never smoked. She has never used smokeless tobacco. She reports that she does not  drink alcohol or use drugs.   Family History:  The patient's family history includes CAD in her father; Parkinson's disease in her mother.    ROS:  Please see the history of present illness.    Review of Systems: Constitutional:  denies fever, chills, diaphoresis, appetite change and fatigue.  HEENT: denies photophobia, eye pain, redness, hearing loss, ear pain, congestion, sore throat, rhinorrhea, sneezing, neck pain, neck stiffness and tinnitus.  Respiratory: denies SOB, DOE, cough, chest tightness, and wheezing.  Cardiovascular: denies chest pain, palpitations and leg swelling.  Gastrointestinal: denies nausea, vomiting, abdominal pain, diarrhea, constipation, blood in stool.  Genitourinary: denies dysuria, urgency, frequency, hematuria, flank pain and difficulty urinating.  Musculoskeletal: denies  myalgias, back pain, joint swelling, arthralgias and gait problem.   Skin: denies pallor, rash and wound.  Neurological: denies dizziness, seizures, syncope, weakness, light-headedness, numbness and headaches.   Hematological: denies adenopathy, easy bruising, personal or family bleeding history.  Psychiatric/ Behavioral: denies suicidal ideation, mood changes, confusion, nervousness, sleep disturbance and agitation.       All other systems are reviewed and negative.    PHYSICAL EXAM: VS:  There were no vitals taken for this visit. , BMI There is no height or weight on file to calculate BMI. GEN: Well nourished, well developed, in no acute distress  HEENT: normal  Neck: no JVD, carotid bruits, or masses Cardiac: RRR; no murmurs, rubs, or gallops,no edema  Respiratory:  clear to auscultation bilaterally, normal work of breathing GI: soft, nontender, nondistended, + BS MS: no deformity or atrophy  Skin: warm and dry, no rash Neuro:  Strength and sensation are intact Psych: normal   EKG:  EKG is not ordered today.   Recent Labs: 04/22/2016: ALT 14; BUN 19; Creatinine, Ser 0.70;  Hemoglobin 14.6; Platelets 205; Potassium 3.9; Sodium 137    Lipid Panel No results found for: CHOL, TRIG, HDL, CHOLHDL, VLDL, LDLCALC, LDLDIRECT    Wt Readings from Last 3 Encounters:  04/15/16 135 lb (61.2 kg)  03/31/16 133 lb 12.8 oz (60.7 kg)  11/22/15 138 lb (62.6 kg)      Other studies Reviewed: Additional studies/ records that were reviewed today include: . Review of the above records demonstrates:    ASSESSMENT AND PLAN:  1.  CAD :     We found a stenosis in the ostium of her  1st diag.    She is on Atorvastatin thinks that it irritates her stomach. She has not been taking it very regular. We'll discontinue the atorvastatin and start her on rosuvastatin 10 mg  a day. We'll check fasting labs today to see where her lipids are at this point .    2. Chronic lyme disease:    Plans per [facility].   Would consider referral to ID if she has additional issues. She Attributes lots of her symptoms to chronic Lyme disease. I told her I did not have any further to contribute.  3. Palpitations:   She feels better on the propranolol   Current medicines are reviewed at length with the patient today.  The patient does not have concerns regarding medicines.  Labs/ tests ordered today include:  No orders of the defined types were placed in this encounter.    Disposition:   FU with me in 6  Months  She will call Marcelino DusterMichelle next week to let us know how she is doing.      Kristeen MissPhilip Powell Halbert, MD  05/15/2016 6:47 PM    [facility] [address], [place], [place]  [address] Phone: [phone]; Fax: [phone]

## 2016-05-16 ENCOUNTER — Ambulatory Visit (INDEPENDENT_AMBULATORY_CARE_PROVIDER_SITE_OTHER): Payer: BLUE CROSS/BLUE SHIELD | Admitting: Cardiovascular Disease

## 2016-05-16 ENCOUNTER — Encounter: Payer: Self-pay | Admitting: Cardiovascular Disease

## 2016-05-16 VITALS — BP 98/54 | HR 67 | Ht 69.0 in | Wt 136.0 lb

## 2016-05-16 DIAGNOSIS — R252 Cramp and spasm: Secondary | ICD-10-CM

## 2016-05-16 DIAGNOSIS — I25119 Atherosclerotic heart disease of native coronary artery with unspecified angina pectoris: Secondary | ICD-10-CM

## 2016-05-16 DIAGNOSIS — E785 Hyperlipidemia, unspecified: Secondary | ICD-10-CM | POA: Diagnosis not present

## 2016-05-16 LAB — HEPATIC FUNCTION PANEL
ALK PHOS: 44 IU/L (ref 39–117)
ALT: 15 IU/L (ref 0–32)
AST: 20 IU/L (ref 0–40)
Albumin: 4.7 g/dL (ref 3.6–4.8)
BILIRUBIN TOTAL: 0.7 mg/dL (ref 0.0–1.2)
BILIRUBIN, DIRECT: 0.23 mg/dL (ref 0.00–0.40)
Total Protein: 7 g/dL (ref 6.0–8.5)

## 2016-05-16 LAB — BASIC METABOLIC PANEL
BUN/Creatinine Ratio: 26 (ref 12–28)
BUN: 22 mg/dL (ref 8–27)
CALCIUM: 9.6 mg/dL (ref 8.7–10.3)
CHLORIDE: 97 mmol/L (ref 96–106)
CO2: 23 mmol/L (ref 18–29)
Creatinine, Ser: 0.86 mg/dL (ref 0.57–1.00)
GFR calc Af Amer: 83 mL/min/{1.73_m2} (ref >59)
GFR, EST NON AFRICAN AMERICAN: 72 mL/min/{1.73_m2} (ref >59)
GLUCOSE: 90 mg/dL (ref 65–99)
POTASSIUM: 4.3 mmol/L (ref 3.5–5.2)
SODIUM: 137 mmol/L (ref 134–144)

## 2016-05-16 LAB — LIPID PANEL
CHOL/HDL RATIO: 2.2 ratio (ref 0.0–4.4)
CHOLESTEROL TOTAL: 191 mg/dL (ref 100–199)
HDL: 87 mg/dL (ref >39)
LDL Calculated: 88 mg/dL (ref 0–99)
TRIGLYCERIDES: 79 mg/dL (ref 0–149)
VLDL Cholesterol Cal: 16 mg/dL (ref 5–40)

## 2016-05-16 LAB — CK: Total CK: 74 U/L (ref 24–173)

## 2016-05-16 MED ORDER — ROSUVASTATIN CALCIUM 10 MG PO TABS: 10.0000 mg | ORAL_TABLET | Freq: Every day | ORAL | 3 refills | Status: DC

## 2016-05-16 NOTE — Patient Instructions (Addendum)
Your physician has recommended you make the following change in your medication:  STOP  ATORVASTATIN   START  GENERIC  CRESTOR  10 MG  Your physician recommends that you return for lab work in:    TODAY AND  AGAIN  IN  3 MONTHS  BMET  CPK LIPID AND  LIVER  Your physician recommends that you schedule a follow-up appointment in:   3 MONTHS WITH DR  Elease HashimotoNAHSER

## 2016-06-02 ENCOUNTER — Telehealth: Payer: Self-pay | Admitting: Cardiovascular Disease

## 2016-06-02 NOTE — Telephone Encounter (Signed)
New message    Patient c/o Palpitations:  High priority if patient c/o lightheadedness and shortness of breath.  1. How long have you been having palpitations? Always on and off (unsure when it started)  2. Are you currently experiencing lightheadedness and shortness of breath? No  3. Have you checked your BP and heart rate? (document readings) No  4. Are you experiencing any other symptoms? Just that the heart is skipping - pt wants to wear a heart monitor and wants someone to call her back about it

## 2016-06-02 NOTE — Telephone Encounter (Signed)
Spoke with patient who states she is having more frequent heart "skipping."  She states the propranolol helps some but she is concerned that it is making her BP too low. She states they occur more frequently in the afternoon and evening. States she feels like the rosuvastatin is also causing low BP. She understands that this medication is not prescribed for BP. She would like to wear a monitor. I asked about increasing her salt intake to help with the hypotension and she has increased it some. She is taking ozone treatments for lyme disease for which she is not aware of any side effects. She states the skipping feels like it hits her hard and takes her breath. She wore a monitor approximately one year ago. I have located the results and will take to Dr. Elease HashimotoNahser for review. She is aware I will call her back.

## 2016-06-02 NOTE — Telephone Encounter (Signed)
Dr. Elease HashimotoNahser reviewed the monitor and advised that it shows PVCs and PACs. He states he believes symptoms are related to malnourishment and advised she add more protein, salt and water to her diet. He advised this at her first office visit.  I advised that if symptoms do not improve, he advised he is willing to order another monitor. I reviewed his advice with her and she verbalized understanding and agreement. She thanked me for the call.

## 2016-07-12 ENCOUNTER — Telehealth: Payer: Self-pay | Admitting: Cardiovascular Disease

## 2016-07-12 NOTE — Telephone Encounter (Signed)
New Message    Pt states she usually has low bp 100/60 but she had a bad night it was 160/90 , what concerns should she have ? What should she do to lower it ?

## 2016-07-12 NOTE — Telephone Encounter (Signed)
Pt  called this am stating that she had a ruff night last night due to her lyme disease that affect the nervous system. Pt took her BP this morning it is 160/90 and she still feels funny, foggy. Pt does not have a history of hypertension. Pt's BP usually runs 100/60. Pt took her Propanolol 10 mg medication that has been prescribed for her palpitations to see if it brings her BP down. Pt took this medication about 40 minutes ago, but her husband went  to work and she is not able to take BP on her own. Pt would like to know if Dr Elease HashimotoNahser have any medication that he  Recommends for her to take when her BP goes up, like today. Dr Elease HashimotoNahser aware of pt's symptoms. He states that it is the body's reaction when she has not be enable to sleep last night , MD states that the Propanolol will bring her BP down and she does not need to check her BP until her husband comes home. Pt to try to rest. Pt is aware of MD, advice. Pt sounds reassured of MD's recommendations. Pt verbalized understanding.

## 2016-08-24 ENCOUNTER — Ambulatory Visit: Payer: BLUE CROSS/BLUE SHIELD | Admitting: Cardiovascular Disease

## 2016-08-26 ENCOUNTER — Encounter: Payer: Self-pay | Admitting: Cardiovascular Disease

## 2016-08-29 ENCOUNTER — Telehealth: Payer: Self-pay | Admitting: Cardiovascular Disease

## 2016-08-29 NOTE — Telephone Encounter (Signed)
New Message     Pt would like Kaitlyn Faulkner to call her, she would like to touch base about her care

## 2016-08-29 NOTE — Telephone Encounter (Signed)
Patient stated she was not a no show that she had talked to answering service and informed them that she was not coming. Patient stated the office was not open yet and her appointment was at 8:00 am, so that is why she talked to the answering service. Will forward a message to scheduling to make note.  Patient would like Marcelino DusterMichelle to call her back at her convenience, that it is not urgent, but she would just like to discuss a few things before her next appointment. Will forward to [place].

## 2016-09-05 NOTE — Telephone Encounter (Signed)
Left message for patient to call back  

## 2016-10-21 ENCOUNTER — Other Ambulatory Visit (HOSPITAL_COMMUNITY): Payer: Self-pay | Admitting: Family Medicine

## 2016-10-21 DIAGNOSIS — R609 Edema, unspecified: Secondary | ICD-10-CM

## 2016-10-22 ENCOUNTER — Encounter (HOSPITAL_COMMUNITY): Payer: Self-pay | Admitting: Emergency Medicine

## 2016-10-22 ENCOUNTER — Emergency Department (HOSPITAL_COMMUNITY): Payer: BLUE CROSS/BLUE SHIELD

## 2016-10-22 ENCOUNTER — Emergency Department (HOSPITAL_COMMUNITY)
Admission: EM | Admit: 2016-10-22 | Discharge: 2016-10-22 | Disposition: A | Discharge status: Home / Self Care | Payer: BLUE CROSS/BLUE SHIELD | Attending: Emergency Medicine | Admitting: Emergency Medicine

## 2016-10-22 DIAGNOSIS — Z5321 Procedure and treatment not carried out due to patient leaving prior to being seen by health care provider: Secondary | ICD-10-CM | POA: Insufficient documentation

## 2016-10-22 DIAGNOSIS — R079 Chest pain, unspecified: Secondary | ICD-10-CM | POA: Insufficient documentation

## 2016-10-22 LAB — BASIC METABOLIC PANEL
ANION GAP: 9 (ref 5–15)
BUN: 9 mg/dL (ref 6–20)
CALCIUM: 9.5 mg/dL (ref 8.9–10.3)
CO2: 25 mmol/L (ref 22–32)
CREATININE: 0.77 mg/dL (ref 0.44–1.00)
Chloride: 101 mmol/L (ref 101–111)
GFR calc Af Amer: >60 mL/min (ref >60)
GFR calc non Af Amer: >60 mL/min (ref >60)
GLUCOSE: 103 mg/dL — AB (ref 65–99)
Potassium: 3.8 mmol/L (ref 3.5–5.1)
Sodium: 135 mmol/L (ref 135–145)

## 2016-10-22 LAB — CBC
HCT: 35.7 % — ABNORMAL LOW (ref 36.0–46.0)
HEMOGLOBIN: 12.9 g/dL (ref 12.0–15.0)
MCH: 31.5 pg (ref 26.0–34.0)
MCHC: 36.1 g/dL — AB (ref 30.0–36.0)
MCV: 87.1 fL (ref 78.0–100.0)
Platelets: 238 10*3/uL (ref 150–400)
RBC: 4.1 MIL/uL (ref 3.87–5.11)
RDW: 11.9 % (ref 11.5–15.5)
WBC: 5.1 10*3/uL (ref 4.0–10.5)

## 2016-10-22 LAB — I-STAT TROPONIN, ED: TROPONIN I, POC: 0 ng/mL (ref 0.00–0.08)

## 2016-10-22 NOTE — ED Triage Notes (Signed)
Pt presents to ED for sudden onset of SOB, chest pressure, and back pain tonight, awakening her from sleep.  Denies other associated symptoms.  Pt seen today at her PCP for possible left DVT, with an ultrasound ordered Monday.  Pt was told to come to ER with any SOB, CP.

## 2016-10-22 NOTE — ED Notes (Signed)
Pt does not answer when called for a room x 3

## 2016-10-24 ENCOUNTER — Inpatient Hospital Stay (HOSPITAL_COMMUNITY): Admission: RE | Admit: 2016-10-24 | Payer: BLUE CROSS/BLUE SHIELD | Source: Ambulatory Visit

## 2016-11-08 ENCOUNTER — Other Ambulatory Visit: Payer: Self-pay | Admitting: Family Medicine

## 2016-11-08 ENCOUNTER — Ambulatory Visit
Admission: RE | Admit: 2016-11-08 | Discharge: 2016-11-08 | Disposition: A | Discharge status: Home / Self Care | Payer: BLUE CROSS/BLUE SHIELD | Source: Ambulatory Visit | Attending: Family Medicine | Admitting: Family Medicine

## 2016-11-08 DIAGNOSIS — M5440 Lumbago with sciatica, unspecified side: Secondary | ICD-10-CM

## 2016-11-17 ENCOUNTER — Ambulatory Visit: Payer: BLUE CROSS/BLUE SHIELD | Admitting: Cardiovascular Disease

## 2016-11-23 ENCOUNTER — Other Ambulatory Visit: Payer: Self-pay | Admitting: Orthopaedic Surgery

## 2016-11-23 DIAGNOSIS — M5417 Radiculopathy, lumbosacral region: Secondary | ICD-10-CM

## 2016-11-29 ENCOUNTER — Ambulatory Visit
Admission: RE | Admit: 2016-11-29 | Discharge: 2016-11-29 | Disposition: A | Discharge status: Home / Self Care | Payer: BLUE CROSS/BLUE SHIELD | Source: Ambulatory Visit | Attending: Orthopaedic Surgery | Admitting: Orthopaedic Surgery

## 2016-11-29 DIAGNOSIS — M5417 Radiculopathy, lumbosacral region: Secondary | ICD-10-CM

## 2016-12-06 ENCOUNTER — Other Ambulatory Visit: Payer: BLUE CROSS/BLUE SHIELD

## 2016-12-14 ENCOUNTER — Other Ambulatory Visit: Payer: Self-pay | Admitting: Rehabilitation

## 2016-12-14 DIAGNOSIS — M84350S Stress fracture, pelvis, sequela: Secondary | ICD-10-CM

## 2016-12-16 ENCOUNTER — Telehealth: Payer: Self-pay | Admitting: Cardiovascular Disease

## 2016-12-16 DIAGNOSIS — R002 Palpitations: Secondary | ICD-10-CM

## 2016-12-16 MED ORDER — METOPROLOL SUCCINATE ER 25 MG PO TB24: 12.5000 mg | ORAL_TABLET | Freq: Every day | ORAL | 3 refills | Status: DC

## 2016-12-16 NOTE — Telephone Encounter (Signed)
Mrs.Abram is calling about getting a monitor . She is having a lot of Palpitations . Please call

## 2016-12-16 NOTE — Telephone Encounter (Signed)
Spoke with patient who states she is having worsening palpitations x 3 days. She states propranolol does not seem to be helping. States they are worse in the evening. States took 3 propranolol tabs throughout the course of the day a few days ago and did not notice any improvement. She states the palpitations improve when she is resting and worsen with exertion. We discussed diet and hydration and she states she is trying to eat and drink well but has recently lost some weight. She reports that she also has problems with hypotension. I advised that this further emphasizes the need for good diet and hydration. I asked about dietary intake of potassium and she reports she eats a lot of leafy green vegetables and sweet potatoes; states she cannot drink V8 or any type of tomato product. Lab work from 8/18 shows K+ level of 3.8. She states she is currently on crutches and wants to know if her husband can come by and pick up a monitor. I advised that monitors must be scheduled and that she would have to be present for that appointment. I advised that I will forward message to Dr. Elease Hashimoto for advice and to ask if he will order 30 day monitor. She thanked me for my patience with her and is aware I will call her back with Dr. Harvie Bridge advice.

## 2016-12-16 NOTE — Telephone Encounter (Signed)
Spoke with patient to advise her that Dr. Elease Hashimoto is in agreement to order a 30 day monitor. I advised her that someone from our office will call her to schedule. She is aware to keep her appointment on 11/7 with Dr. Elease Hashimoto. I talked with her about starting Toprol XL 12.5 mg daily. She previously took Toprol XL 25 mg once daily and felt that it caused hypotension. I advised that this lower dose may be more effective for her palpitations and not lower her BP as much as the higher dose. She states she is willing to try and Rx was sent to [facility]. I advised her to monitor HR and BP and to call back with questions or concerns. She verbalized understanding and agreement and thanked me for the call.

## 2016-12-16 NOTE — Telephone Encounter (Signed)
I agree with note from [employer], RN She needs to eat / drink more potassium containing foods. The 30 day monitor would be useful to assess her palpitations Continue to try propranolol  Her BP is low - she could consider Toprol XL 12.5 mg a day to try to suppress the palpitations - and she could still take the propranolol as needed.

## 2016-12-20 ENCOUNTER — Ambulatory Visit
Admission: RE | Admit: 2016-12-20 | Discharge: 2016-12-20 | Disposition: A | Discharge status: Home / Self Care | Payer: BLUE CROSS/BLUE SHIELD | Source: Ambulatory Visit | Attending: Rehabilitation | Admitting: Rehabilitation

## 2016-12-20 DIAGNOSIS — M84350S Stress fracture, pelvis, sequela: Secondary | ICD-10-CM

## 2016-12-21 ENCOUNTER — Other Ambulatory Visit: Payer: BLUE CROSS/BLUE SHIELD

## 2016-12-28 ENCOUNTER — Ambulatory Visit (INDEPENDENT_AMBULATORY_CARE_PROVIDER_SITE_OTHER): Payer: BLUE CROSS/BLUE SHIELD

## 2016-12-28 DIAGNOSIS — R002 Palpitations: Secondary | ICD-10-CM | POA: Diagnosis not present

## 2017-01-11 ENCOUNTER — Ambulatory Visit (INDEPENDENT_AMBULATORY_CARE_PROVIDER_SITE_OTHER): Payer: BLUE CROSS/BLUE SHIELD | Admitting: Cardiovascular Disease

## 2017-01-11 ENCOUNTER — Encounter: Payer: Self-pay | Admitting: Cardiovascular Disease

## 2017-01-11 ENCOUNTER — Ambulatory Visit: Payer: BLUE CROSS/BLUE SHIELD | Admitting: Cardiovascular Disease

## 2017-01-11 VITALS — BP 110/70 | HR 71 | Ht 69.0 in | Wt 133.0 lb

## 2017-01-11 DIAGNOSIS — G901 Familial dysautonomia [Riley-Day]: Secondary | ICD-10-CM | POA: Diagnosis not present

## 2017-01-11 DIAGNOSIS — I2510 Atherosclerotic heart disease of native coronary artery without angina pectoris: Secondary | ICD-10-CM | POA: Diagnosis not present

## 2017-01-11 DIAGNOSIS — E782 Mixed hyperlipidemia: Secondary | ICD-10-CM

## 2017-01-11 LAB — LIPID PANEL
CHOL/HDL RATIO: 2.3 ratio (ref 0.0–4.4)
Cholesterol, Total: 180 mg/dL (ref 100–199)
HDL: 79 mg/dL (ref >39)
LDL Calculated: 91 mg/dL (ref 0–99)
TRIGLYCERIDES: 52 mg/dL (ref 0–149)
VLDL Cholesterol Cal: 10 mg/dL (ref 5–40)

## 2017-01-11 LAB — HEPATIC FUNCTION PANEL
ALBUMIN: 4.7 g/dL (ref 3.6–4.8)
ALK PHOS: 45 IU/L (ref 39–117)
ALT: 10 IU/L (ref 0–32)
AST: 17 IU/L (ref 0–40)
Bilirubin Total: 0.6 mg/dL (ref 0.0–1.2)
Bilirubin, Direct: 0.14 mg/dL (ref 0.00–0.40)
Total Protein: 6.9 g/dL (ref 6.0–8.5)

## 2017-01-11 LAB — BASIC METABOLIC PANEL
BUN / CREAT RATIO: 19 (ref 12–28)
BUN: 13 mg/dL (ref 8–27)
CO2: 25 mmol/L (ref 20–29)
CREATININE: 0.7 mg/dL (ref 0.57–1.00)
Calcium: 9.7 mg/dL (ref 8.7–10.3)
Chloride: 99 mmol/L (ref 96–106)
GFR, EST AFRICAN AMERICAN: 106 mL/min/{1.73_m2} (ref >59)
GFR, EST NON AFRICAN AMERICAN: 92 mL/min/{1.73_m2} (ref >59)
Glucose: 98 mg/dL (ref 65–99)
POTASSIUM: 4.6 mmol/L (ref 3.5–5.2)
SODIUM: 138 mmol/L (ref 134–144)

## 2017-01-11 NOTE — Progress Notes (Signed)
Cardiology Office Note   Date:  01/11/2017   ID:  Kaitlyn Faulkner, DOB [birth year], MRN [id number]  PCP:  Dois Davenport, MD  [name].  Cardiologist:   Kristeen Miss, MD   Chief Complaint  Patient presents with  . Orthostatic hypotension   Problem List 1. Chest pain  2. Lyme disease  3. Palpitations    History of Present Illness: Kaitlyn Faulkner is a 66 y.o. female who presents for eval of chest pain  Has had Lyme disease - diagnosed with Lyme disease 2 years ago .   Is having some neuro issues  Has also been having chest pain issues Feels like "something is taking over her body" Ringing in her ears,   Gets sweats,  Nauseated,  Pressure in left side of neck, left arm pain  Can last several hours Had an episode last night  Has several episodes each week  Is not associated with standing  Occasionally is brought on my eating   Has had a stress test 2 years ago, ECGs always look fine. Has been seen in the ER several times and nothing has shown up Has had an echo  Has been to a medical intuitive doctor - who thought that she had some pulmonary issues.    Father had heart disease in his 75   Has Lyme disease .  She did not complete the course of Abx.  Caused stomach issues Is considering getting Ozone treatments at [facility]   Is vegetarian, has been losing weight  Eats 2 pea protein smoothies a day .  Not much other protein . Maybe 1/2 an egg a week.   May 16, 2016:  Kaitlyn Faulkner is seen today . We scheduled a myoview after her last visit myoview shows a subtile anterior and apical defect. She is quite thin and this is unlikely to be breast artifact. She continues to have significant symptoms.  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   Medical therapy was advised.   Has had a rough winter according to her She was having ? stroke symptoms on Feb. 16 and went to the ER.   Had some dissorientation.   Right arm numbness Had CT  scan looked good  She thinks it could be Lyme disease.    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  Nov. 7, 2018:  Kaitlyn Faulkner is doing better from a cardiac standpoint. Has a back injury. Is in a wheelchair  BP has been well controlled.   Has chronic Lyme disease and has continued to have issues with palps ( which she thinks are related) .   Also has lots of stresses.    Is not walking at all - has had this back injury for months.  Seeing a chiropractor and a spine doctor  Has  Episodes of atypical CP .   Associated with being very warm and palpitations , followed by a sensation of being cold .   Past Medical History:  Diagnosis Date  . Hair loss   . Lyme disease   . Palpitations     Past Surgical History:  Procedure Laterality Date  . DILATION AND CURETTAGE OF UTERUS  1971     Current Outpatient Medications  Medication Sig Dispense Refill  . aspirin 325 MG tablet Take 325 mg by mouth daily as needed for moderate pain or headache.    . Cholecalciferol (VITAMIN D3) 5000 units CAPS  Take 5,000 Units by mouth daily.    . Digestive Enzyme CAPS Take 1 capsule by mouth 2 (two) times daily.    . metoprolol succinate (TOPROL XL) 25 MG 24 hr tablet Take 0.5 tablets (12.5 mg total) by mouth daily. 45 tablet 3  . Omega-3 Fatty Acids (FISH OIL) 1000 MG CAPS Take 2,000 mg by mouth daily.    Marland Kitchen. PRESCRIPTION MEDICATION as directed. Estradiol 2mg  capsule, take 2mg s daily for 3 weeks on 1 week off     . PRESCRIPTION MEDICATION as directed. Progesterone 300mg s capsule, take 300mg s daily for 3 weeks off 1 week.     . propranolol (INDERAL) 10 MG tablet Take 10 mg by mouth 4 (four) times daily as needed (irregular heartbeat).    . vitamin E 400 UNIT capsule Take 400 Units by mouth 2 (two) times daily.    . rosuvastatin (CRESTOR) 10 MG tablet Take 1 tablet (10 mg total) by mouth daily. 90 tablet 3   No current facility-administered  medications for this visit.     Allergies:   Gluten meal and Lactose intolerance (gi)    Social History:  The patient  reports that  has never smoked. she has never used smokeless tobacco. She reports that she does not drink alcohol or use drugs.   Family History:  The patient's family history includes CAD in her father; Parkinson's disease in her mother.   ROS:  Please see the history of present illness.   Review of Systems: As her HPI, otherwise negative.     All other systems are reviewed and negative.   Physical Exam: Blood pressure 110/70, pulse 71, height 5\' 9"  (1.753 m), weight 133 lb (60.3 kg), SpO2 98 %.  GEN:  Well nourished, well developed in no acute distress HEENT: Normal NECK: No JVD; No carotid bruits LYMPHATICS: No lymphadenopathy CARDIAC: RR, no murmurs, rubs, gallops RESPIRATORY:  Clear to auscultation without rales, wheezing or rhonchi  ABDOMEN: Soft, non-tender, non-distended MUSCULOSKELETAL:  No edema; No deformity  SKIN: Warm and dry NEUROLOGIC:  Alert and oriented x 3    EKG:  EKG is not ordered today.   Recent Labs: 05/16/2016: ALT 15 10/22/2016: BUN 9; Creatinine, Ser 0.77; Hemoglobin 12.9; Platelets 238; Potassium 3.8; Sodium 135    Lipid Panel    Component Value Date/Time   CHOL 191 05/16/2016 1042   TRIG 79 05/16/2016 1042   HDL 87 05/16/2016 1042   CHOLHDL 2.2 05/16/2016 1042   LDLCALC 88 05/16/2016 1042      Wt Readings from Last 3 Encounters:  01/11/17 133 lb (60.3 kg)  05/16/16 136 lb (61.7 kg)  04/15/16 135 lb (61.2 kg)      Other studies Reviewed: Additional studies/ records that were reviewed today include: . Review of the above records demonstrates:    ASSESSMENT AND PLAN:  1.  CAD :     Has a stenosis in D1.  Has not been having any angina.    2. Orthostasis:   May have dysautonomia .  She would like to see Dr. Graciela HusbandsKlein.  .  I suggested that she may want to discuss this with her  Primary MD.  Will get the monitor  back and see if it would be appropriate for her to see Dr. Graciela HusbandsKlein.   2. Chronic lyme disease:      3. Palpitations:    Prelim strips shows benign PVCs  Current medicines are reviewed at length with the patient today.  The patient does not  have concerns regarding medicines.  Labs/ tests ordered today include:  No orders of the defined types were placed in this encounter.    Disposition:   FU with me in 6  Months       Kristeen MissPhilip Aleta Manternach, MD  01/11/2017 8:12 AM    [facility] [address], [place], [place]  [address] Phone: [phone]; Fax: [phone]

## 2017-01-11 NOTE — Patient Instructions (Signed)
Medication Instructions:  Your physician recommends that you continue on your current medications as directed. Please refer to the Current Medication list given to you today.   Labwork: TODAY - basic metabolic panel, liver panel, cholesterol   Testing/Procedures: None Ordered   Follow-Up: You have been referred to Dr. Graciela HusbandsKlein for dysautonomia   Your physician wants you to follow-up in: 6 months with Dr. Elease HashimotoNahser.  You will receive a reminder letter in the mail two months in advance. If you don't receive a letter, please call our office to schedule the follow-up appointment.   If you need a refill on your cardiac medications before your next appointment, please call your pharmacy.   Thank you for choosing [facility]! Eligha BridegroomMichelle Heather Mckendree, RN [phone]

## 2017-01-23 ENCOUNTER — Ambulatory Visit: Payer: BLUE CROSS/BLUE SHIELD | Admitting: Cardiovascular Disease

## 2017-01-31 ENCOUNTER — Telehealth: Payer: Self-pay | Admitting: Cardiovascular Disease

## 2017-01-31 NOTE — Telephone Encounter (Signed)
Patient calling, states that she is returning your call.  °

## 2017-01-31 NOTE — Telephone Encounter (Signed)
Reviewed monitor results with patient who verbalized understanding. She states she is in a Lyme Disease support group and other patients report similar symptoms as her. She is aware to manage symptoms with current medications, diet, and rest. She is currently unable to get moderate exercise due to a back injury and she states she does note improvement with exercise. I advised her to call back with additional questions or concerns. She was very thankful for the call.

## 2017-02-10 ENCOUNTER — Ambulatory Visit: Payer: BLUE CROSS/BLUE SHIELD | Admitting: Rehabilitative and Restorative Service Providers"

## 2017-02-15 ENCOUNTER — Encounter: Payer: Self-pay | Admitting: Rehabilitative and Restorative Service Providers"

## 2017-02-15 ENCOUNTER — Ambulatory Visit (INDEPENDENT_AMBULATORY_CARE_PROVIDER_SITE_OTHER): Payer: Medicare Other | Admitting: Rehabilitative and Restorative Service Providers"

## 2017-02-15 DIAGNOSIS — M79604 Pain in right leg: Secondary | ICD-10-CM | POA: Diagnosis not present

## 2017-02-15 DIAGNOSIS — R29898 Other symptoms and signs involving the musculoskeletal system: Secondary | ICD-10-CM

## 2017-02-15 DIAGNOSIS — R2689 Other abnormalities of gait and mobility: Secondary | ICD-10-CM

## 2017-02-15 DIAGNOSIS — G8929 Other chronic pain: Secondary | ICD-10-CM | POA: Diagnosis not present

## 2017-02-15 DIAGNOSIS — M545 Low back pain: Secondary | ICD-10-CM | POA: Diagnosis present

## 2017-02-15 NOTE — Therapy (Addendum)
[facility] [address] [place], [place], [address] Phone: [phone]   Fax:  [phone]  Physical Therapy Evaluation  Patient Details  Name: Kaitlyn DubonnetBarbara Faulkner MRN: [id number] Date of Birth: 21-Jul-1950 Referring Provider: Dr Letta Kocherhomas Saullo   Encounter Date: 02/15/2017  PT End of Session - 02/15/17 1629    Visit Number  1    Number of Visits  12    Date for PT Re-Evaluation  03/29/17    PT Start Time  1519    PT Stop Time  1633    PT Time Calculation (min)  74 min    Activity Tolerance  Patient tolerated treatment well       Past Medical History:  Diagnosis Date  . Hair loss   . Lyme disease   . Palpitations     Past Surgical History:  Procedure Laterality Date  . DILATION AND CURETTAGE OF UTERUS  1971  . LEFT HEART CATH AND CORONARY ANGIOGRAPHY N/A 04/15/2016   Procedure: Left Heart Cath and Coronary Angiography;  Surgeon: Yvonne Kendallhristopher End, MD;  [facility];  Service: Cardiovascular;  Laterality: N/A;    There were no vitals filed for this visit.   Subjective Assessment - 02/15/17 1526    Subjective  Symptoms started mid July whe she was stomping at a bee. She experienced back and SI into Rt LE. She has had limited activity and continued pain since that time. She has received therapy through [facility] for 30 visits August through November 2018. She has not walked without crutches since a couple of days after the initial onset of pain.     Pertinent History  Iniital injury ~ 15 years ago when she fell from a fountain landing on her sacrum. She healed after about 4 years but always continued to have some back discomfort. She has learned to manage back pain with controlling activities and how she sits, etc..Marland Kitchen.Lyme's Disease     How long can you sit comfortably?  5 min     How long can you stand comfortably?  1 min    How long can you walk comfortably?  not at all - can walk with crutches  functional distances     Diagnostic tests  xrays; MRI     Patient Stated Goals  walk without crutches - painfree     Currently in Pain?  Yes    Pain Score  5     Pain Location  Back    Pain Orientation  Right;Lower    Pain Radiating Towards  Rt SI/hip area - at times into the Rt LE was into the side now into the posterior thigh into the foot     Pain Onset  More than a month ago    Pain Frequency  Intermittent    Aggravating Factors   prolonged sitting; prolonged standing or walking; wt bearing on Rt LE; driving pressing gas    Pain Relieving Factors  avoid activities that cause pain; ice; US with massage helped on temporary basis with pain; chiropractic care(activation points, pressure point work - temporary help)          [facility] - 02/15/17 0001      Assessment   Medical Diagnosis  Rt lumbar; SI; LE pain     Referring Provider  Dr Letta Kocherhomas Saullo    Onset Date/Surgical Date  09/18/16    Hand Dominance  Right    Next MD Visit  02/21/17  Prior Therapy  yes       Precautions   Precautions  None      Restrictions   Weight Bearing Restrictions  No      Balance Screen   Has the patient fallen in the past 6 months  No    Has the patient had a decrease in activity level because of a fear of falling?   No    Is the patient reluctant to leave their home because of a fear of falling?   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     Leisure  household chores; walking dog 25-30 min a day; brushing and caring for horses       Observation/Other Assessments   Focus on Therapeutic Outcomes (FOTO)   58% limitation       Sensation   Additional Comments  WFL's per pt report       Posture/Postural Control   Posture Comments  sits with rounded trunk and  spine; standing -       AROM   AROM Assessment Site  -- discomfort with all motioins; refused trunk extension     Lumbar Flexion  60%    Lumbar Extension  0%    Lumbar - Right Side Bend  30%    Lumbar - Left Side Bend  40%    Lumbar - Right Rotation  20%    Lumbar - Left Rotation  15%      Strength   Overall Strength Comments  grossly WFL's bilat LE's patient difficult in resistive testing       Flexibility   Hamstrings  tight bilat Rt > Lt    Quadriceps  tight Rt > Lt    ITB  tight bilat     Piriformis  tight bilat       Palpation   Spinal mobility  hypomobile lumbar spine with CPA and lateral mobs     SI assessment   Rt tight/painful > Lt     Palpation comment  tight Rt > Lt lumbar paraspinals; QL; lats; piriformis; gluts       Ambulation/Gait   Gait Comments  ambulates with bilat axillary crutches decreased wt bearing Rt and wt shift to Rt; flexed forward posture             Objective measurements completed on examination: See above findings.      [facility] Adult PT Treatment/Exercise - 02/15/17 0001      Therapeutic Activites    Therapeutic Activities  -- education wt shift to midline       Lumbar Exercises: Stretches   Passive Hamstring Stretch  2 reps;30 seconds supine with strap     Quad Stretch  2 reps;30 seconds prone with strap     ITB Stretch  2 reps;30 seconds supine with strap     Piriformis Stretch  2 reps;60 seconds supine travell LB flat       Moist Heat Therapy   Number Minutes Moist Heat  20 Minutes    Moist Heat Location  Lumbar Spine;Hip Rt SI      Electrical Stimulation   Electrical Stimulation Location  Rt SI/hip  Electrical Stimulation Action  IFC     Electrical Stimulation Parameters  to tolerance    Electrical Stimulation Goals  Pain;Tone       Therapeutic exercise time - 20 min       PT Education - 02/15/17 1616    Education provided  Yes    Education Details  HEP TENS DN     Person(s) Educated  Patient    Methods   Explanation;Demonstration;Tactile cues;Verbal cues;Handout    Comprehension  Verbalized understanding;Returned demonstration;Verbal cues required;Tactile cues required          PT Long Term Goals - 02/15/17 1653      PT LONG TERM GOAL #1   Title  Improve wt bearing through Rt LE allowing patient to stand for functional activities with equal wt bearing through bilat LE's 03/29/17    Time  6    Period  Weeks    Status  New      PT LONG TERM GOAL #2   Title  Ambulate with least assistive device with minimal pain and safe gait pattern 03/29/17    Time  6    Period  Weeks    Status  New      PT LONG TERM GOAL #3   Title  Decrease pain 50-75% allowing patient to preform functional activities with decreased pain and improved ADL function 03/29/17    Time  6    Period  Weeks    Status  New      PT LONG TERM GOAL #4   Title  Independent in HEP 03/29/17    Time  6    Period  Weeks    Status  New      PT LONG TERM GOAL #5   Title  Improve FOTO to </= 45% limitation 03/29/17    Time  6    Period  Weeks    Status  New             Plan - 02/15/17 1630    Clinical Impression Statement  Patient presents with chronic LBP and Rt SI/hip pain. She has limited trunk and LE mobilty; hypomobility through the lumbar and SI joints; muscular tightness through the lumbar and Rt hip musculature anterior and posterior musculature; abnormal wt bearing and gait pattern siftied to Lt. Ambulates with bilat axillary crutches with touch down wt bearing Rt LE. Patient reports limited ADL's and functional activities. She will benefit from PT to address problems identified.     Clinical Presentation  Stable    Clinical Decision Making  Low    Rehab Potential  Good    PT Frequency  2x / week    PT Duration  6 weeks    PT Treatment/Interventions  Patient/family education;ADLs/Self Care Home Management;Cryotherapy;Electrical Stimulation;Iontophoresis 4mg /ml Dexamethasone;Moist Heat;Ultrasound;Dry  needling;Manual techniques;Neuromuscular re-education;Gait training;Therapeutic activities;Therapeutic exercise    PT Next Visit Plan  review HEP; progress with core stabilization; DN; manual work; education; modalities as indicated     Financial planner with Plan of Care  Patient       Patient will benefit from skilled therapeutic intervention in order to improve the following deficits and impairments:  Postural dysfunction, Improper body mechanics, Abnormal gait, Decreased activity tolerance, Increased fascial restricitons, Increased muscle spasms, Decreased range of motion, Decreased mobility, Pain  Visit Diagnosis: Chronic right-sided low back pain, with sciatica presence unspecified - Plan: PT plan of care cert/re-cert  Other symptoms and signs involving the musculoskeletal system - Plan: PT plan of  care cert/re-cert  Pain in right leg - Plan: PT plan of care cert/re-cert  Other abnormalities of gait and mobility - Plan: PT plan of care cert/re-cert  [facility] PT PB G-CODES - 02/15/17 1702    Functional Assessment Tool Used   Evaluation; FOTO; clinical assessment     Functional Limitations  Mobility: Walking and moving around    Mobility: Walking and Moving Around Current Status  At least 60 percent but less than 80 percent impaired, limited or restricted    Mobility: Walking and Moving Around Goal Status [phone]([id number])  At least 40 percent but less than 60 percent impaired, limited or restricted        Problem List Patient Active Problem List   Diagnosis Date Noted  . Chest pain 04/15/2016  . Abnormal stress test 04/15/2016  . Orthostatic hypotension 03/31/2016  . DYSPEPSIA 01/06/2009    Anastasiya Gowin Rober MinionP Kaycee Mcgaugh PT, MPH 02/15/2017, 5:09 PM  [facility] [address] [place], [place], [address] Phone: [phone]   Fax:  [phone]  Name: Kaitlyn DubonnetBarbara Faulkner MRN: [id number] Date of Birth: 08/09/1950

## 2017-02-15 NOTE — Patient Instructions (Signed)
HIP: Hamstrings - Supine  Place strap around foot. Raise leg up, keeping knee straight.  Bend opposite knee to protect back if indicated. Hold 30 seconds. 3 reps per set, 2-3 sets per day  Outer Hip Stretch: Reclined IT Band Stretch (Strap)   Strap around one foot, pull leg across body until you feel a pull or stretch in the outside of your hip, with shoulders on mat. Hold for 30 seconds. Repeat 3 times each leg. 2-3 times/day.  Piriformis Stretch   Lying on back, pull right knee toward opposite shoulder. Hold 30 seconds. Repeat 3 times. Do 2-3 sessions per day.   Quads / HF, Prone KNEE: Quadriceps - Prone    Place strap around ankle. Bring ankle toward buttocks. Press hip into surface. Hold 30 seconds. Repeat 3 times per session. Do 2-3 sessions per day.  TENS UNIT: This is helpful for muscle pain and spasm.   Search and Purchase a TENS 7000 2nd edition at www.tenspros.com. It should be less than $30.     TENS unit instructions: Do not shower or bathe with the unit on Turn the unit off before removing electrodes or batteries If the electrodes lose stickiness add a drop of water to the electrodes after they are disconnected from the unit and place on plastic sheet. If you continued to have difficulty, call the TENS unit company to purchase more electrodes. Do not apply lotion on the skin area prior to use. Make sure the skin is clean and dry as this will help prolong the life of the electrodes. After use, always check skin for unusual red areas, rash or other skin difficulties. If there are any skin problems, does not apply electrodes to the same area. Never remove the electrodes from the unit by pulling the wires. Do not use the TENS unit or electrodes other than as directed. Do not change electrode placement without consultating your therapist or physician. Keep 2 fingers with between each electrode.   Trigger Point Dry Needling  . What is Trigger Point Dry Needling  (DN)? o DN is a physical therapy technique used to treat muscle pain and dysfunction. Specifically, DN helps deactivate muscle trigger points (muscle knots).  o A thin filiform needle is used to penetrate the skin and stimulate the underlying trigger point. The goal is for a local twitch response (LTR) to occur and for the trigger point to relax. No medication of any kind is injected during the procedure.   . What Does Trigger Point Dry Needling Feel Like?  o The procedure feels different for each individual patient. Some patients report that they do not actually feel the needle enter the skin and overall the process is not painful. Very mild bleeding may occur. However, many patients feel a deep cramping in the muscle in which the needle was inserted. This is the local twitch response.   . How Will I feel after the treatment? o Soreness is normal, and the onset of soreness may not occur for a few hours. Typically this soreness does not last longer than two days.  o Bruising is uncommon, however; ice can be used to decrease any possible bruising.  o In rare cases feeling tired or nauseous after the treatment is normal. In addition, your symptoms may get worse before they get better, this period will typically not last longer than 24 hours.   . What Can I do After My Treatment? o Increase your hydration by drinking more water for the next 24 hours.   o You may place ice or heat on the areas treated that have become sore, however, do not use heat on inflamed or bruised areas. Heat often brings more relief post needling. o You can continue your regular activities, but vigorous activity is not recommended initially after the treatment for 24 hours. o DN is best combined with other physical therapy such as strengthening, stretching, and other therapies.     

## 2017-02-20 ENCOUNTER — Ambulatory Visit (INDEPENDENT_AMBULATORY_CARE_PROVIDER_SITE_OTHER): Payer: Medicare Other | Admitting: Rehabilitative and Restorative Service Providers"

## 2017-02-20 ENCOUNTER — Encounter: Payer: Self-pay | Admitting: Rehabilitative and Restorative Service Providers"

## 2017-02-20 DIAGNOSIS — R29898 Other symptoms and signs involving the musculoskeletal system: Secondary | ICD-10-CM

## 2017-02-20 DIAGNOSIS — R2689 Other abnormalities of gait and mobility: Secondary | ICD-10-CM

## 2017-02-20 DIAGNOSIS — M79604 Pain in right leg: Secondary | ICD-10-CM

## 2017-02-20 DIAGNOSIS — M545 Low back pain: Secondary | ICD-10-CM | POA: Diagnosis present

## 2017-02-20 DIAGNOSIS — G8929 Other chronic pain: Secondary | ICD-10-CM

## 2017-02-20 NOTE — Therapy (Signed)
[facility] [address] [place], [place], [address] Phone: [phone]   Fax:  [phone]  Physical Therapy Treatment  Patient Details  Name: Kaitlyn DubonnetBarbara Faulkner MRN: [id number] Date of Birth: 1950/07/22 Referring Provider: Dr Letta Kocherhomas Saullo   Encounter Date: 02/20/2017  PT End of Session - 02/20/17 1023    Visit Number  2    Number of Visits  12    Date for PT Re-Evaluation  03/29/17    PT Start Time  1019    PT Stop Time  1115    PT Time Calculation (min)  56 min    Activity Tolerance  Patient tolerated treatment well       Past Medical History:  Diagnosis Date  . Hair loss   . Lyme disease   . Palpitations     Past Surgical History:  Procedure Laterality Date  . DILATION AND CURETTAGE OF UTERUS  1971  . LEFT HEART CATH AND CORONARY ANGIOGRAPHY N/A 04/15/2016   Procedure: Left Heart Cath and Coronary Angiography;  Surgeon: Yvonne Kendallhristopher End, MD;  [facility];  Service: Cardiovascular;  Laterality: N/A;    There were no vitals filed for this visit.  Subjective Assessment - 02/20/17 1024    Subjective  Patient reports that she had a flare from trying a psoas stretch at the chiropractors Friday and then again yesterday riding in her husbands truck yesterday. Some better today but still hurts.     Currently in Pain?  Yes    Pain Score  8     Pain Location  Back    Pain Orientation  Right;Lower    Pain Descriptors / Indicators  Tightness    Pain Type  Chronic pain    Pain Onset  More than a month ago    Pain Frequency  Intermittent                      [facility] Adult PT Treatment/Exercise - 02/20/17 0001      Lumbar Exercises: Stretches   Passive Hamstring Stretch  2 reps;30 seconds supine with strap modified w/bending knee prior to stretch     Quad Stretch  2 reps;30 seconds prone with strap     ITB Stretch  2 reps;30 seconds supine with strap     Piriformis Stretch  2 reps;60  seconds supine travell LB flat       Moist Heat Therapy   Number Minutes Moist Heat  20 Minutes    Moist Heat Location  Lumbar Spine;Hip Rt SI      Electrical Stimulation   Electrical Stimulation Location  Rt SI/hip/LB     Electrical Stimulation Action  IFC    Electrical Stimulation Parameters  to tolerance    Electrical Stimulation Goals  Pain;Tone       Trigger Point Dry Needling - 02/20/17 1330    Consent Given?  Yes    Education Handout Provided  Yes    Muscles Treated Lower Body  -- lumbar paraspinals Rt palpable decrease in tightness     Gluteus Maximus Response  Palpable increased muscle length    Piriformis Response  Palpable increased muscle length           PT Education - 02/20/17 1144    Education provided  Yes    Education Details  DN    Person(s) Educated  Patient    Methods  Explanation    Comprehension  Verbalized understanding  PT Long Term Goals - 02/20/17 1142      PT LONG TERM GOAL #1   Title  Improve wt bearing through Rt LE allowing patient to stand for functional activities with equal wt bearing through bilat LE's 03/29/17    Time  6    Period  Weeks    Status  On-going      PT LONG TERM GOAL #2   Title  Ambulate with least assistive device with minimal pain and safe gait pattern 03/29/17    Time  6    Period  Weeks    Status  On-going      PT LONG TERM GOAL #3   Title  Decrease pain 50-75% allowing patient to perform functional activities with decreased pain and improved ADL function 03/29/17    Time  6    Period  Weeks    Status  On-going      PT LONG TERM GOAL #4   Title  Independent in HEP 03/29/17    Time  6    Period  Weeks    Status  On-going      PT LONG TERM GOAL #5   Title  Improve FOTO to </= 45% limitation 03/29/17    Time  6    Period  Weeks    Status  On-going            Plan - 02/20/17 1332    Clinical Impression Statement  Kaitlyn Faulkner presents today with flare up of symptoms. She has increased pain in  the Rt lumbar and hip area following trial of psoas stretch in supine at chiropractors office and riding in a truck yesterday. She was hesitant to perform exercises in clinic due to pain. She tolerated DN well with report of some pain but palpable decrease in muscular tightness noted in Rt lumbar, QL and hip musculature. Discussed at length trial of water exercise and offered suggestions of pool. We will provide exercises for water program when patient has pool lined up. Encouraged continued consistent exercise even when she is flared up. Encouraged ambulation with at least touch down wt bearing Rt LE when patient is ambulating with crutches. We will continue efforts to progress patient toward stated goals of therapy.     Rehab Potential  Good    PT Frequency  2x / week    PT Treatment/Interventions  Patient/family education;ADLs/Self Care Home Management;Cryotherapy;Electrical Stimulation;Iontophoresis 4mg /ml Dexamethasone;Moist Heat;Ultrasound;Dry needling;Manual techniques;Neuromuscular re-education;Gait training;Therapeutic activities;Therapeutic exercise    PT Next Visit Plan  review HEP; progress with core stabilization; assess response to DN; manual work; education; modalities as indicated     Financial planner with Plan of Care  Patient       Patient will benefit from skilled therapeutic intervention in order to improve the following deficits and impairments:  Postural dysfunction, Improper body mechanics, Abnormal gait, Decreased activity tolerance, Increased fascial restricitons, Increased muscle spasms, Decreased range of motion, Decreased mobility, Pain  Visit Diagnosis: Chronic right-sided low back pain, with sciatica presence unspecified  Other symptoms and signs involving the musculoskeletal system  Pain in right leg  Other abnormalities of gait and mobility     Problem List Patient Active Problem List   Diagnosis Date Noted  . Chest pain 04/15/2016  . Abnormal stress test  04/15/2016  . Orthostatic hypotension 03/31/2016  . DYSPEPSIA 01/06/2009    Faryn Sieg Rober Minion PT, MPH  02/20/2017, 1:39 PM  [facility] [address] [place],  [place], [id number] Phone: [phone]   Fax:  [phone]  Name: Kaitlyn DubonnetBarbara Faulkner MRN: [id number] Date of Birth: November 13, 1950

## 2017-02-20 NOTE — Patient Instructions (Signed)

## 2017-02-22 DIAGNOSIS — R002 Palpitations: Secondary | ICD-10-CM | POA: Insufficient documentation

## 2017-02-22 DIAGNOSIS — A6920 Lyme disease, unspecified: Secondary | ICD-10-CM | POA: Insufficient documentation

## 2017-02-22 DIAGNOSIS — L659 Nonscarring hair loss, unspecified: Secondary | ICD-10-CM | POA: Insufficient documentation

## 2017-02-23 ENCOUNTER — Ambulatory Visit (INDEPENDENT_AMBULATORY_CARE_PROVIDER_SITE_OTHER): Payer: Medicare Other | Admitting: Physical Therapy

## 2017-02-23 DIAGNOSIS — M79604 Pain in right leg: Secondary | ICD-10-CM | POA: Diagnosis not present

## 2017-02-23 DIAGNOSIS — M545 Low back pain: Secondary | ICD-10-CM | POA: Diagnosis not present

## 2017-02-23 DIAGNOSIS — R29898 Other symptoms and signs involving the musculoskeletal system: Secondary | ICD-10-CM

## 2017-02-23 DIAGNOSIS — G8929 Other chronic pain: Secondary | ICD-10-CM

## 2017-02-23 DIAGNOSIS — R2689 Other abnormalities of gait and mobility: Secondary | ICD-10-CM

## 2017-02-23 NOTE — Therapy (Signed)
[place], [place], [address] Phone: [phone]   Fax:  [phone]  Physical Therapy Treatment  Patient Details  Name: Kaitlyn Faulkner MRN: [id number] Date of Birth: 09/27/1950 Referring Provider: Dr. Zonia Kief   Encounter Date: 02/23/2017  PT End of Session - 02/23/17 0938    Visit Number  3    Number of Visits  12    Date for PT Re-Evaluation  03/29/17    PT Start Time  4917 pt arrived late    PT Stop Time  1021    PT Time Calculation (min)  43 min       Past Medical History:  Diagnosis Date  . Hair loss   . Lyme disease   . Palpitations     Past Surgical History:  Procedure Laterality Date  . DILATION AND CURETTAGE OF UTERUS  1971  . LEFT HEART CATH AND CORONARY ANGIOGRAPHY N/A 04/15/2016   Procedure: Left Heart Cath and Coronary Angiography;  Surgeon: Nelva Bush, MD;  Location: [facility];  Service: Cardiovascular;  Laterality: N/A;    There were no vitals filed for this visit.  Subjective Assessment - 02/23/17 0939    Subjective  Pt reports she was sore for 2 days following DN last session.  She brought her TENS unit in to get help with understanding application.     Currently in Pain?  Yes    Pain Score  5     Pain Location  Sacrum    Pain Orientation  Right    Aggravating Factors   prolonged sitting or standing    Pain Relieving Factors  ?         [facility] PT Assessment - 02/23/17 0001      Assessment   Medical Diagnosis  Rt lumbar; SI; LE pain     Referring Provider  Dr. Zonia Kief    Onset Date/Surgical Date  09/18/16    Hand Dominance  Right      Palpation   SI assessment   Rt on Rt sacral torsion, Rt elevated illium, Rt PSIS elevated.      Palpation comment  tender ASIS and iliac crest area on Rt.         [facility] Adult PT Treatment/Exercise - 02/23/17 0001      Self-Care   Self-Care  Other Self-Care Comments    Other Self-Care Comments   Pt  educated on safety, application and use of TENS unit for home use.  Pt verbalized understanding and returned demo of operation of unit.       Modalities   Modalities  -- pt declined, will use at home.       Manual Therapy   Manual Therapy  Muscle Energy Technique;Myofascial release;Soft tissue mobilization    Soft tissue mobilization  TPR to Rt QL and lumbar paraspinals; Rt glute and piriformis.     Myofascial Release  to Rt psoas, Rt QL.     Muscle Energy Technique  MET to correct high Rt ilium (in Rt sidelying); Rt on Rt sacral torsion (in prone with contract relax of hip rotators)                   PT Long Term Goals - 02/20/17 1142      PT LONG TERM GOAL #1   Title  Improve wt bearing through Rt LE allowing patient to stand for functional activities with equal wt bearing through bilat LE's 03/29/17  Time  6    Period  Weeks    Status  On-going      PT LONG TERM GOAL #2   Title  Ambulate with least assistive device with minimal pain and safe gait pattern 03/29/17    Time  6    Period  Weeks    Status  On-going      PT LONG TERM GOAL #3   Title  Decrease pain 50-75% allowing patient to perform functional activities with decreased pain and improved ADL function 03/29/17    Time  6    Period  Weeks    Status  On-going      PT LONG TERM GOAL #4   Title  Independent in HEP 03/29/17    Time  6    Period  Weeks    Status  On-going      PT LONG TERM GOAL #5   Title  Improve FOTO to </= 45% limitation 03/29/17    Time  6    Period  Weeks    Status  On-going            Plan - 02/23/17 1513    Clinical Impression Statement  Pt reporting minimal change in pain since eval.  She continues to be hesitant to therapeutic exercise due to flare ups in past.  Pelvis alignment assessed.  Rt ilium appeared higher in standing, and Rt on Rt sacral torsion present.  Pt tolerated gentle MET corrections without increase in pain, slight change apparent in ilium height.  Pt  declined modalities and left with her TENS electrodes in place on her posterior Rt hip and back.       Rehab Potential  Good    PT Frequency  2x / week    PT Duration  6 weeks    PT Treatment/Interventions  Patient/family education;ADLs/Self Care Home Management;Cryotherapy;Electrical Stimulation;Iontophoresis 41m/ml Dexamethasone;Moist Heat;Ultrasound;Dry needling;Manual techniques;Neuromuscular re-education;Gait training;Therapeutic activities;Therapeutic exercise    PT Next Visit Plan  assess pelvis alignment and correct as tolerated.  progress core stabilization as tolerated.      Consulted and Agree with Plan of Care  Patient       Patient will benefit from skilled therapeutic intervention in order to improve the following deficits and impairments:  Postural dysfunction, Improper body mechanics, Abnormal gait, Decreased activity tolerance, Increased fascial restricitons, Increased muscle spasms, Decreased range of motion, Decreased mobility, Pain  Visit Diagnosis: Chronic right-sided low back pain, with sciatica presence unspecified  Other symptoms and signs involving the musculoskeletal system  Pain in right leg  Other abnormalities of gait and mobility     Problem List Patient Active Problem List   Diagnosis Date Noted  . Palpitations   . Lyme disease   . Hair loss   . Chest pain 04/15/2016  . Abnormal stress test 04/15/2016  . Orthostatic hypotension 03/31/2016  . DYSPEPSIA 01/06/2009   JKerin Perna PTA 02/23/17 3:21 PM  [address] [address]Phone: [phone]  Fax:  [phone] Name: BSemira StoltzfusMRN: [id number] of Birth: [birth year]

## 2017-02-24 ENCOUNTER — Encounter: Payer: Medicare Other | Admitting: Rehabilitative and Restorative Service Providers"

## 2017-03-02 ENCOUNTER — Encounter: Payer: Medicare Other | Admitting: Rehabilitative and Restorative Service Providers"

## 2017-03-09 ENCOUNTER — Institutional Professional Consult (permissible substitution): Payer: BLUE CROSS/BLUE SHIELD | Admitting: Internal Medicine

## 2017-03-09 ENCOUNTER — Ambulatory Visit (INDEPENDENT_AMBULATORY_CARE_PROVIDER_SITE_OTHER): Payer: Medicare Other | Admitting: Physical Therapy

## 2017-03-09 DIAGNOSIS — M545 Low back pain: Secondary | ICD-10-CM | POA: Diagnosis present

## 2017-03-09 DIAGNOSIS — R29898 Other symptoms and signs involving the musculoskeletal system: Secondary | ICD-10-CM

## 2017-03-09 DIAGNOSIS — R2689 Other abnormalities of gait and mobility: Secondary | ICD-10-CM

## 2017-03-09 DIAGNOSIS — M79604 Pain in right leg: Secondary | ICD-10-CM | POA: Diagnosis not present

## 2017-03-09 DIAGNOSIS — G8929 Other chronic pain: Secondary | ICD-10-CM

## 2017-03-09 NOTE — Therapy (Signed)
[place], [place], [address] Phone: [phone]   Fax:  [phone]  Physical Therapy Treatment  Patient Details  Name: Kaitlyn Faulkner MRN: [id number] Date of Birth: 02-16-1951 Referring Provider: Dr. Zonia Kief   Encounter Date: 03/09/2017  PT End of Session - 03/09/17 1024    Visit Number  4    Number of Visits  12    Date for PT Re-Evaluation  03/29/17    PT Start Time  1022    PT Stop Time  1119    PT Time Calculation (min)  57 min       Past Medical History:  Diagnosis Date  . Hair loss   . Lyme disease   . Palpitations     Past Surgical History:  Procedure Laterality Date  . DILATION AND CURETTAGE OF UTERUS  1971  . LEFT HEART CATH AND CORONARY ANGIOGRAPHY N/A 04/15/2016   Procedure: Left Heart Cath and Coronary Angiography;  Surgeon: Nelva Bush, MD;  Location: [facility];  Service: Cardiovascular;  Laterality: N/A;    There were no vitals filed for this visit.  Subjective Assessment - 03/09/17 1024    Subjective  Pt reports 1.5 wk ago she walked a couple steps without crutches.   She received a shot in her Rt SI joint.  "It's the hip area that is keeping me from moving".   Any time she bears weight, pain is intense in her Rt lateral hip.   She requested a xray of hip and was told no.  She is discouraged.     Pertinent History  Iniital injury ~ 15 years ago when she fell from a fountain landing on her sacrum. She healed after about 4 years but always continued to have some back discomfort. She has learned to manage back pain with controlling activities and how she sits, etc..Marland KitchenLyme's Disease     Patient Stated Goals  walk without crutches - painfree     Currently in Pain?  Yes    Pain Score  6     Pain Location  Hip    Pain Orientation  Right    Pain Descriptors / Indicators  Tightness;Aching    Aggravating Factors   prolonged sitting    Pain Relieving Factors  ??          [facility] PT Assessment - 03/09/17 0001      Assessment   Medical Diagnosis                                                                        Referring Provider  Dr. Zonia Kief    Onset Date/Surgical Date  09/18/16    Hand Dominance  Right      Flexibility   Hamstrings  RLE 82 deg      Palpation   SI assessment   Rt on Rt sacral torsion, Rt elevated illium, Rt PSIS elevated.      Palpation comment  tender Rt PSIS, tender along Rt iliac crest.          [facility] Adult PT Treatment/Exercise - 03/09/17 0001      Lumbar Exercises: Stretches   Passive Hamstring Stretch  2 reps;30 seconds RLE,  supine with strap      Lumbar Exercises: Standing   Other Standing Lumbar Exercises  Standing Rt hip ext to tolerance (with wt on LLE, UE with crutches) x 3 reps       Lumbar Exercises: Supine   Bridge  Compliant;10 reps;3 seconds      Modalities   Modalities  Electrical Stimulation;Moist Heat      Moist Heat Therapy   Number Minutes Moist Heat  15 Minutes    Moist Heat Location  Lumbar Spine      Electrical Stimulation   Electrical Stimulation Location  Rt SI/hip/LB     Electrical Stimulation Action  IFC    Electrical Stimulation Parameters  to tolerance     Electrical Stimulation Goals  Pain      Manual Therapy   Soft tissue mobilization  TPR to Rt QL and lumbar paraspinals; Rt glute and piriformis.     Myofascial Release  to Rt psoas, Rt QL.     Muscle Energy Technique  MET to correct high Rt ilium (in Lt sidelying); Rt on Rt sacral torsion (in prone with contract relax of hip rotators)                   PT Long Term Goals - 02/20/17 1142      PT LONG TERM GOAL #1   Title  Improve wt bearing through Rt LE allowing patient to stand for functional activities with equal wt bearing through bilat LE's 03/29/17    Time  6    Period  Weeks    Status  On-going      PT LONG TERM GOAL #2   Title  Ambulate with least assistive device with minimal pain and safe  gait pattern 03/29/17    Time  6    Period  Weeks    Status  On-going      PT LONG TERM GOAL #3   Title  Decrease pain 50-75% allowing patient to perform functional activities with decreased pain and improved ADL function 03/29/17    Time  6    Period  Weeks    Status  On-going      PT LONG TERM GOAL #4   Title  Independent in HEP 03/29/17    Time  6    Period  Weeks    Status  On-going      PT LONG TERM GOAL #5   Title  Improve FOTO to </= 45% limitation 03/29/17    Time  6    Period  Weeks    Status  On-going            Plan - 03/09/17 1326    Clinical Impression Statement  Pt's pelvis alignment similar to last visit 2 wks ago.  Improved height of Rt ilium after MET correction.  Pt had difficulty tolerating bridges after MET corrections, but was able to complete set of 10 reps.  Pt reported reduction of pain at end of session.  Pt making very gradual progress towards goals.     Rehab Potential  Good    PT Frequency  2x / week    PT Duration  6 weeks    PT Treatment/Interventions  Patient/family education;ADLs/Self Care Home Management;Cryotherapy;Electrical Stimulation;Iontophoresis '4mg'$ /ml Dexamethasone;Moist Heat;Ultrasound;Dry needling;Manual techniques;Neuromuscular re-education;Gait training;Therapeutic activities;Therapeutic exercise    PT Next Visit Plan  assess pelvis alignment and correct as tolerated.  progress core stabilization as tolerated.      Consulted and Agree with Plan of  Care  Patient       Patient will benefit from skilled therapeutic intervention in order to improve the following deficits and impairments:  Postural dysfunction, Improper body mechanics, Abnormal gait, Decreased activity tolerance, Increased fascial restricitons, Increased muscle spasms, Decreased range of motion, Decreased mobility, Pain  Visit Diagnosis: Chronic right-sided low back pain, with sciatica presence unspecified  Other symptoms and signs involving the musculoskeletal  system  Pain in right leg  Other abnormalities of gait and mobility     Problem List Patient Active Problem List   Diagnosis Date Noted  . Palpitations   . Lyme disease   . Hair loss   . Chest pain 04/15/2016  . Abnormal stress test 04/15/2016  . Orthostatic hypotension 03/31/2016  . DYSPEPSIA 01/06/2009   Kerin Perna, PTA 03/09/17 1:31 PM  [facility] [place], [place], [address] Phone: [phone]   Fax:  [phone]  Name: Kaitlyn Faulkner MRN: [id number] Date of Birth: 10-17-1950

## 2017-03-14 ENCOUNTER — Ambulatory Visit (INDEPENDENT_AMBULATORY_CARE_PROVIDER_SITE_OTHER): Payer: Medicare Other | Admitting: Physical Therapy

## 2017-03-14 DIAGNOSIS — M79604 Pain in right leg: Secondary | ICD-10-CM

## 2017-03-14 DIAGNOSIS — G8929 Other chronic pain: Secondary | ICD-10-CM | POA: Diagnosis not present

## 2017-03-14 DIAGNOSIS — M545 Low back pain: Secondary | ICD-10-CM | POA: Diagnosis not present

## 2017-03-14 DIAGNOSIS — R29898 Other symptoms and signs involving the musculoskeletal system: Secondary | ICD-10-CM | POA: Diagnosis not present

## 2017-03-14 DIAGNOSIS — R2689 Other abnormalities of gait and mobility: Secondary | ICD-10-CM

## 2017-03-14 NOTE — Therapy (Signed)
[place], [place], [address] Phone: [phone]   Fax:  [phone]  Physical Therapy Treatment  Patient Details  Name: Kaitlyn Faulkner MRN: [id number] Date of Birth: [birth year] Referring Provider: Dr. Zonia Kief   Encounter Date: 03/14/2017  PT End of Session - 03/14/17 0945    Visit Number  5    Number of Visits  12    Date for PT Re-Evaluation  03/29/17    PT Start Time  0935    PT Stop Time  1034    PT Time Calculation (min)  59 min    Activity Tolerance  Patient tolerated treatment well    Behavior During Therapy  [facility] for tasks assessed/performed       Past Medical History:  Diagnosis Date  . Hair loss   . Lyme disease   . Palpitations     Past Surgical History:  Procedure Laterality Date  . DILATION AND CURETTAGE OF UTERUS  1971  . LEFT HEART CATH AND CORONARY ANGIOGRAPHY N/A 04/15/2016   Procedure: Left Heart Cath and Coronary Angiography;  Surgeon: Nelva Bush, MD;  Location: [facility];  Service: Cardiovascular;  Laterality: N/A;    There were no vitals filed for this visit.  Subjective Assessment - 03/14/17 0945    Currently in Pain?  Yes    Pain Score  3     Pain Location  Hip    Pain Orientation  Right    Pain Descriptors / Indicators  Tightness;Dull    Aggravating Factors   prolonged sitting     Pain Relieving Factors  ?         [facility] PT Assessment - 03/14/17 0001      Assessment   Medical Diagnosis  Rt lumbar; SI; LE pain     Referring Provider  Dr. Zonia Kief    Onset Date/Surgical Date  09/18/16    Hand Dominance  Right      Palpation   SI assessment   Rt ASIS lower, Rt ilium elevated, Rt PSIS higher than Lt.  Rt sacral torsion         [facility] Adult PT Treatment/Exercise - 03/14/17 0001      Lumbar Exercises: Stretches   Hip Flexor Stretch  3 reps;20 seconds standing with leg Rt ext    ITB Stretch  2 reps LLE, 2 attempts in standing;1  reps in supine with PTA assist      Lumbar Exercises: Supine   Bridge  10 reps;3 seconds with arm press down      Lumbar Exercises: Prone   Opposite Arm/Leg Raise  Right arm/Left leg;Left arm/Right leg;5 reps;Limitations    Opposite Arm/Leg Raise Limitations  pt hesitant to lift Rt arm/Lt leg, but able to complete      Modalities   Modalities  Electrical Stimulation;Moist Heat      Moist Heat Therapy   Number Minutes Moist Heat  15 Minutes    Moist Heat Location  Lumbar Spine      Electrical Stimulation   Electrical Stimulation Location  Rt SI/hip/LB     Electrical Stimulation Action  IFC    Electrical Stimulation Parameters  to tolerance     Electrical Stimulation Goals  Pain      Manual Therapy   Soft tissue mobilization  light STM to Rt post hip     Muscle Energy Technique  MET to correct high Rt ilium (in Rt sidelying); Rt on Rt sacral  torsion (in prone with contract relax of hip rotators) ; MET to correct ant rotation of Rt side (in supine, contract relax of Rt hamstring)              PT Education - 03/14/17 1029    Education provided  Yes    Education Details  HEP     Person(s) Educated  Patient    Methods  Explanation;Handout;Verbal cues;Demonstration;Tactile cues    Comprehension  Verbalized understanding;Returned demonstration          PT Long Term Goals - 03/14/17 1237      PT LONG TERM GOAL #1   Title  Improve wt bearing through Rt LE allowing patient to stand for functional activities with equal wt bearing through bilat LE's 03/29/17    Time  6    Period  Weeks    Status  On-going progressing; with axillary crutches      PT LONG TERM GOAL #2   Title  Ambulate with least assistive device with minimal pain and safe gait pattern 03/29/17    Time  6    Period  Weeks    Status  On-going      PT LONG TERM GOAL #3   Title  Decrease pain 50-75% allowing patient to perform functional activities with decreased pain and improved ADL function 03/29/17     Time  6    Period  Weeks    Status  On-going      PT LONG TERM GOAL #4   Title  Independent in HEP 03/29/17    Status  On-going      PT LONG TERM GOAL #5   Title  Improve FOTO to </= 45% limitation 03/29/17    Time  6    Period  Weeks    Status  On-going            Plan - 03/14/17 1233    Clinical Impression Statement  Gait observed with improved/increased Rt hip ext during toe off.  She was able to tolerate increased exercise this visit, with minimal increase in pain/symptoms.  Her pelvis alignment was similar to last visit; improved alignment after MET corrections.  Pt reported reduction of pain at end of session.      Rehab Potential  Good    PT Frequency  2x / week    PT Duration  6 weeks    PT Treatment/Interventions  Patient/family education;ADLs/Self Care Home Management;Cryotherapy;Electrical Stimulation;Iontophoresis '4mg'$ /ml Dexamethasone;Moist Heat;Ultrasound;Dry needling;Manual techniques;Neuromuscular re-education;Gait training;Therapeutic activities;Therapeutic exercise    PT Next Visit Plan  assess pelvis alignment and correct as tolerated.  progress core stabilization as tolerated.      Consulted and Agree with Plan of Care  Patient       Patient will benefit from skilled therapeutic intervention in order to improve the following deficits and impairments:  Postural dysfunction, Improper body mechanics, Abnormal gait, Decreased activity tolerance, Increased fascial restricitons, Increased muscle spasms, Decreased range of motion, Decreased mobility, Pain  Visit Diagnosis: Chronic right-sided low back pain, with sciatica presence unspecified  Other symptoms and signs involving the musculoskeletal system  Pain in right leg  Other abnormalities of gait and mobility     Problem List Patient Active Problem List   Diagnosis Date Noted  . Palpitations   . Lyme disease   . Hair loss   . Chest pain 04/15/2016  . Abnormal stress test 04/15/2016  . Orthostatic  hypotension 03/31/2016  . DYSPEPSIA 01/06/2009   Kerin Perna, PTA 03/14/17  12:48 PM  [facility] [place], [place], [address] Phone: [phone]   Fax:  [phone]  Name: Kaitlyn Faulkner MRN: [id number] Date of Birth: 1950-03-22

## 2017-03-14 NOTE — Patient Instructions (Signed)
Therapeutic - Bridging    Lift buttocks, keeping back straight and arms on floor. Hold _3___ seconds. Repeat _10___ times.  Arm / Leg Lift: Opposite (Prone)    Lift right leg and opposite arm __1-2__ inches from floor, keeping knee locked. Repeat __5-10__ times per set. Do __1__ sets per session. Do _1___ sessions per day.   [facility] [address] [place], [place] [address]  [phone] (office) [phone] (fax)

## 2017-03-17 ENCOUNTER — Ambulatory Visit (INDEPENDENT_AMBULATORY_CARE_PROVIDER_SITE_OTHER): Payer: Medicare Other | Admitting: Physical Therapy

## 2017-03-17 DIAGNOSIS — R29898 Other symptoms and signs involving the musculoskeletal system: Secondary | ICD-10-CM | POA: Diagnosis not present

## 2017-03-17 DIAGNOSIS — M79604 Pain in right leg: Secondary | ICD-10-CM

## 2017-03-17 DIAGNOSIS — M545 Low back pain: Secondary | ICD-10-CM | POA: Diagnosis not present

## 2017-03-17 DIAGNOSIS — G8929 Other chronic pain: Secondary | ICD-10-CM

## 2017-03-17 DIAGNOSIS — R2689 Other abnormalities of gait and mobility: Secondary | ICD-10-CM | POA: Diagnosis not present

## 2017-03-17 NOTE — Therapy (Signed)
[place], [place], [address] Phone: [phone]   Fax:  [phone]  Physical Therapy Treatment  Patient Details  Name: Kaitlyn Faulkner MRN: [id number] Date of Birth: April 09, 1950 Referring Provider: Dr. Zonia Kief   Encounter Date: 03/17/2017  PT End of Session - 03/17/17 1025    Visit Number  6    Number of Visits  12    Date for PT Re-Evaluation  03/29/17    PT Start Time  1019    PT Stop Time  1118    PT Time Calculation (min)  59 min    Activity Tolerance  Patient tolerated treatment well;No increased pain    Behavior During Therapy  [facility] for tasks assessed/performed       Past Medical History:  Diagnosis Date  . Hair loss   . Lyme disease   . Palpitations     Past Surgical History:  Procedure Laterality Date  . DILATION AND CURETTAGE OF UTERUS  1971  . LEFT HEART CATH AND CORONARY ANGIOGRAPHY N/A 04/15/2016   Procedure: Left Heart Cath and Coronary Angiography;  Surgeon: Nelva Bush, MD;  Location: [facility];  Service: Cardiovascular;  Laterality: N/A;    There were no vitals filed for this visit.  Subjective Assessment - 03/17/17 1025    Subjective  Pt reports she is getting excited about feeling better.  She took a lap around the back yard for the first time in 6 months(with crutches), "it felt so freeing".      Currently in Pain?  Yes    Pain Score  3     Pain Location  Hip    Pain Orientation  Right    Pain Descriptors / Indicators  Dull    Aggravating Factors   prolonged sitting     Pain Relieving Factors  TENS         [facility] PT Assessment - 03/17/17 0001      Assessment   Medical Diagnosis  Rt lumbar; SI; LE pain     Referring Provider  Dr. Zonia Kief    Onset Date/Surgical Date  09/18/16    Hand Dominance  Right      Palpation   SI assessment   Rt ASIS lower, Rt ilium elevated, Rt PSIS higher than Lt.slight   Rt sacral torsion    Palpation  comment  less tender in QL on Rt; improved tolerance to manual therapy to Rt hip today.                   [facility] Adult PT Treatment/Exercise - 03/17/17 0001      Lumbar Exercises: Stretches   Passive Hamstring Stretch  2 reps;30 seconds RLE, supine with strap    Hip Flexor Stretch  3 reps;20 seconds standing with leg Rt ext      Lumbar Exercises: Standing   Other Standing Lumbar Exercises  Trial of use of single crutch or SPC - pt took one step and declined to use any further reporting too much fear of hurting hip; returned to bilat axillary crutches.       Lumbar Exercises: Seated   Sit to Stand  10 reps VC for even weight between feet, no UE support      Lumbar Exercises: Supine   Bridge  10 reps;3 seconds with arm press down      Lumbar Exercises: Prone   Opposite Arm/Leg Raise  Right arm/Left leg;Left arm/Right leg;5 reps 2 sets  Moist Heat Therapy   Number Minutes Moist Heat  15 Minutes    Moist Heat Location  Lumbar Spine;Hip Rt hip      Electrical Stimulation   Electrical Stimulation Location  Rt SI/hip/LB     Electrical Stimulation Action  IFC    Electrical Stimulation Parameters  to tolerance    Electrical Stimulation Goals  Pain      Manual Therapy   Manual Therapy  Soft tissue mobilization;Muscle Energy Technique    Soft tissue mobilization  STM to Rt glute and hip rotators; TPR to Rt hip rotators in Lt sidelying with contract relax.      Muscle Energy Technique  MET to correct high Rt ilium (in Rt sidelying with pillow under hip);  MET to correct ant rotation of Rt side (in supine, contract relax of Rt hamstring)              PT Education - 03/17/17 1144    Education provided  Yes    Education Details  HEP - standing hip ext, opp arm/leg, bridge, sit to/from stand    Person(s) Educated  Patient    Methods  Explanation;Demonstration;Verbal cues    Comprehension  Returned demonstration;Verbalized understanding          PT Long Term Goals  - 03/14/17 1237      PT LONG TERM GOAL #1   Title  Improve wt bearing through Rt LE allowing patient to stand for functional activities with equal wt bearing through bilat LE's 03/29/17    Time  6    Period  Weeks    Status  On-going progressing; with axillary crutches      PT LONG TERM GOAL #2   Title  Ambulate with least assistive device with minimal pain and safe gait pattern 03/29/17    Time  6    Period  Weeks    Status  On-going      PT LONG TERM GOAL #3   Title  Decrease pain 50-75% allowing patient to perform functional activities with decreased pain and improved ADL function 03/29/17    Time  6    Period  Weeks    Status  On-going      PT LONG TERM GOAL #4   Title  Independent in HEP 03/29/17    Status  On-going      PT LONG TERM GOAL #5   Title  Improve FOTO to </= 45% limitation 03/29/17    Time  6    Period  Weeks    Status  On-going            Plan - 03/17/17 1140    Clinical Impression Statement  Pt reporting improved mobility in home and community since last visit. She demonstrated improved tolerance for exercises.  Hip symmetry improved after MET corrections.  Progressing well towards goals.     Rehab Potential  Good    PT Frequency  2x / week    PT Duration  6 weeks    PT Treatment/Interventions  Patient/family education;ADLs/Self Care Home Management;Cryotherapy;Electrical Stimulation;Iontophoresis '4mg'$ /ml Dexamethasone;Moist Heat;Ultrasound;Dry needling;Manual techniques;Neuromuscular re-education;Gait training;Therapeutic activities;Therapeutic exercise    PT Next Visit Plan  Continue to gradually add exercises to HEP, trial of ionto patch.     Consulted and Agree with Plan of Care  Patient       Patient will benefit from skilled therapeutic intervention in order to improve the following deficits and impairments:  Postural dysfunction, Improper body mechanics, Abnormal gait, Decreased activity  tolerance, Increased fascial restricitons, Increased muscle  spasms, Decreased range of motion, Decreased mobility, Pain  Visit Diagnosis: Chronic right-sided low back pain, with sciatica presence unspecified  Other symptoms and signs involving the musculoskeletal system  Pain in right leg  Other abnormalities of gait and mobility     Problem List Patient Active Problem List   Diagnosis Date Noted  . Palpitations   . Lyme disease   . Hair loss   . Chest pain 04/15/2016  . Abnormal stress test 04/15/2016  . Orthostatic hypotension 03/31/2016  . DYSPEPSIA 01/06/2009   Kerin Perna, PTA 03/17/17 11:45 AM  [place] [facility] [place], [place], [address] Phone: [phone]   Fax:  [phone]  Name: Veronda Gabor MRN: [id number] Date of Birth: 09-Jan-1951

## 2017-03-21 ENCOUNTER — Ambulatory Visit (INDEPENDENT_AMBULATORY_CARE_PROVIDER_SITE_OTHER): Payer: Medicare Other | Admitting: Physical Therapy

## 2017-03-21 ENCOUNTER — Encounter: Payer: Medicare Other | Admitting: Physical Therapy

## 2017-03-21 DIAGNOSIS — M545 Low back pain: Secondary | ICD-10-CM | POA: Diagnosis not present

## 2017-03-21 DIAGNOSIS — R29898 Other symptoms and signs involving the musculoskeletal system: Secondary | ICD-10-CM

## 2017-03-21 DIAGNOSIS — M79604 Pain in right leg: Secondary | ICD-10-CM | POA: Diagnosis not present

## 2017-03-21 DIAGNOSIS — R2689 Other abnormalities of gait and mobility: Secondary | ICD-10-CM

## 2017-03-21 DIAGNOSIS — G8929 Other chronic pain: Secondary | ICD-10-CM | POA: Diagnosis not present

## 2017-03-21 NOTE — Therapy (Signed)
[facility] [place] [address] [place], [place], [address] Phone: [phone]   Fax:  [phone]  Physical Therapy Treatment  Patient Details  Name: Kaitlyn Faulkner MRN: [id number] Date of Birth: 1950/09/11 Referring Provider: Dr. Letta Kocher   Encounter Date: 03/21/2017  PT End of Session - 03/21/17 1156    Visit Number  7    Number of Visits  12    Date for PT Re-Evaluation  03/29/17    PT Start Time  1103    PT Stop Time  1146    PT Time Calculation (min)  43 min    Behavior During Therapy  [facility] for tasks assessed/performed       Past Medical History:  Diagnosis Date  . Hair loss   . Lyme disease   . Palpitations     Past Surgical History:  Procedure Laterality Date  . DILATION AND CURETTAGE OF UTERUS  1971  . LEFT HEART CATH AND CORONARY ANGIOGRAPHY N/A 04/15/2016   Procedure: Left Heart Cath and Coronary Angiography;  Surgeon: Yvonne Kendall, MD;  Location: [facility];  Service: Cardiovascular;  Laterality: N/A;    There were no vitals filed for this visit.  Subjective Assessment - 03/21/17 1105    Subjective  "I feel like I flared things up yesterday".  She believes she performed too much standing hip ext and bridges.     Currently in Pain?  Yes    Pain Score  5     Pain Location  Hip    Pain Orientation  Right    Pain Descriptors / Indicators  Dull    Aggravating Factors   prolonged sitting or standing     Pain Relieving Factors  TENS, massage         [facility] PT Assessment - 03/21/17 0001      Assessment   Medical Diagnosis  Rt lumbar; SI; LE pain     Referring Provider  Dr. Letta Kocher    Onset Date/Surgical Date  09/18/16    Hand Dominance  Right        [facility] Adult PT Treatment/Exercise - 03/21/17 0001      Lumbar Exercises: Stretches   Passive Hamstring Stretch  Right;Left;1 rep;30 seconds    Hip Flexor Stretch  3 reps;20 seconds standing with leg Rt ext    Piriformis Stretch   Right;Left;2 reps;30 seconds      Lumbar Exercises: Standing   Other Standing Lumbar Exercises  weight shifts (Rt foot forward and back) onto scale to assess WB x 10 reps - up to 60-70% total body weight.       Lumbar Exercises: Seated   Sit to Stand  10 reps VC for even weight between feet, no UE support      Knee/Hip Exercises: Standing   Forward Step Up  Right;1 set;5 reps;Hand Hold: 2 3" step    Stairs  up and down 5 steps with bilat axillary crutches, step to pattern leading with LLE x 1 rep - to encourage pt to perform step like this at home (instead of going up/down on buttocks)      Modalities   Modalities  Iontophoresis      Iontophoresis   Type of Iontophoresis  Dexamethasone    Location  Rt SI joint    Dose  1.3 cc     Time  40mA, 14 hr patch      Manual Therapy   Soft tissue mobilization  STM to  Rt glute and hip rotators; TPR to Rt QL.              PT Education - 03/21/17 1254    Education provided  Yes    Education Details  ionto info    Person(s) Educated  Patient    Methods  Explanation;Handout    Comprehension  Verbalized understanding          PT Long Term Goals - 03/14/17 1237      PT LONG TERM GOAL #1   Title  Improve wt bearing through Rt LE allowing patient to stand for functional activities with equal wt bearing through bilat LE's 03/29/17    Time  6    Period  Weeks    Status  On-going progressing; with axillary crutches      PT LONG TERM GOAL #2   Title  Ambulate with least assistive device with minimal pain and safe gait pattern 03/29/17    Time  6    Period  Weeks    Status  On-going      PT LONG TERM GOAL #3   Title  Decrease pain 50-75% allowing patient to perform functional activities with decreased pain and improved ADL function 03/29/17    Time  6    Period  Weeks    Status  On-going      PT LONG TERM GOAL #4   Title  Independent in HEP 03/29/17    Status  On-going      PT LONG TERM GOAL #5   Title  Improve FOTO to </=  45% limitation 03/29/17    Time  6    Period  Weeks    Status  On-going            Plan - 03/21/17 1211    Clinical Impression Statement  Pt tolerated some standing exercises today with min increase in pain. She was able to weight shift up to 70% of body wt onto RLE.   Hips appear more symmetrical than last session.  Pt unable to stay for estim/MHP due to another appt afterwards.  Pt progressing towards goals.     Rehab Potential  Good    PT Frequency  2x / week    PT Duration  6 weeks    PT Treatment/Interventions  Patient/family education;ADLs/Self Care Home Management;Cryotherapy;Electrical Stimulation;Iontophoresis 4mg /ml Dexamethasone;Moist Heat;Ultrasound;Dry needling;Manual techniques;Neuromuscular re-education;Gait training;Therapeutic activities;Therapeutic exercise    PT Next Visit Plan  Continue to gradually add exercises to HEP, trial of ionto patch.        Patient will benefit from skilled therapeutic intervention in order to improve the following deficits and impairments:  Postural dysfunction, Improper body mechanics, Abnormal gait, Decreased activity tolerance, Increased fascial restricitons, Increased muscle spasms, Decreased range of motion, Decreased mobility, Pain  Visit Diagnosis: Chronic right-sided low back pain, with sciatica presence unspecified  Other symptoms and signs involving the musculoskeletal system  Pain in right leg  Other abnormalities of gait and mobility     Problem List Patient Active Problem List   Diagnosis Date Noted  . Palpitations   . Lyme disease   . Hair loss   . Chest pain 04/15/2016  . Abnormal stress test 04/15/2016  . Orthostatic hypotension 03/31/2016  . DYSPEPSIA 01/06/2009   Mayer CamelJennifer Carlson-Long, PTA 03/21/17 12:57 PM  [facility] [place] [address] [place], [place], [address] Phone: [phone]   Fax:  [phone]  Name: Kaitlyn DubonnetBarbara Faulkner MRN:  [id number] Date of Birth:  08/14/1950   

## 2017-03-21 NOTE — Patient Instructions (Signed)

## 2017-03-22 ENCOUNTER — Encounter: Payer: Medicare Other | Admitting: Physical Therapy

## 2017-03-24 ENCOUNTER — Ambulatory Visit (INDEPENDENT_AMBULATORY_CARE_PROVIDER_SITE_OTHER): Payer: Medicare Other | Admitting: Physical Therapy

## 2017-03-24 DIAGNOSIS — R29898 Other symptoms and signs involving the musculoskeletal system: Secondary | ICD-10-CM | POA: Diagnosis not present

## 2017-03-24 DIAGNOSIS — M545 Low back pain: Secondary | ICD-10-CM

## 2017-03-24 DIAGNOSIS — G8929 Other chronic pain: Secondary | ICD-10-CM

## 2017-03-24 DIAGNOSIS — R2689 Other abnormalities of gait and mobility: Secondary | ICD-10-CM | POA: Diagnosis not present

## 2017-03-24 DIAGNOSIS — M79604 Pain in right leg: Secondary | ICD-10-CM | POA: Diagnosis not present

## 2017-03-24 NOTE — Therapy (Signed)
[place], [place], [address] Phone: [phone]   Fax:  [phone]  Physical Therapy Treatment  Patient Details  Name: Kaitlyn Faulkner MRN: [id number] Date of Birth: 08-06-1950 Referring Provider: Dr. Zonia Kief   Encounter Date: 03/24/2017  PT End of Session - 03/24/17 1023    Visit Number  8    Number of Visits  12    Date for PT Re-Evaluation  03/29/17    PT Start Time  1019    PT Stop Time  1115    PT Time Calculation (min)  56 min    Activity Tolerance  Patient tolerated treatment well    Behavior During Therapy  [facility] for tasks assessed/performed       Past Medical History:  Diagnosis Date  . Hair loss   . Lyme disease   . Palpitations     Past Surgical History:  Procedure Laterality Date  . DILATION AND CURETTAGE OF UTERUS  1971  . LEFT HEART CATH AND CORONARY ANGIOGRAPHY N/A 04/15/2016   Procedure: Left Heart Cath and Coronary Angiography;  Surgeon: Nelva Bush, MD;  Location: [place] [facility];  Service: Cardiovascular;  Laterality: N/A;    There were no vitals filed for this visit.  Subjective Assessment - 03/24/17 1023    Subjective  Pt reports she may have overdone it so she had "a rest day" yesterday.  She reports the ionto patch fell off as soon as she used restroom, so she's unsure if it helped any. She reports she is continually afraid of flaring up her hip.     Patient Stated Goals  walk without crutches - painfree     Currently in Pain?  Yes    Pain Score  5  up to 7/10    Pain Location  Hip    Pain Orientation  Right    Pain Descriptors / Indicators  Dull;Aching    Aggravating Factors   prolonged sitting, standing    Pain Relieving Factors  TENS, massage, heat.          [facility] PT Assessment - 03/24/17 0001      Assessment   Medical Diagnosis  Rt lumbar; SI; LE pain     Referring Provider  Dr. Zonia Kief    Onset Date/Surgical Date  09/18/16    Hand  Dominance  Right      Palpation   SI assessment   Rt ilium slightly higher than Lt.  ASIS and PSIS ~=; Lt sacral rotation    Palpation comment  point tender in Rt PSIS         [facility] Adult PT Treatment/Exercise - 03/24/17 0001      Lumbar Exercises: Standing   Other Standing Lumbar Exercises  trial of high kneeling on 3" pad (to encourage WB through RLE and attempt Rt hip flexor stretch) - pt able to high kneel bilat, but unwilling to try Rt high kneeling with Lt t forward.        Lumbar Exercises: Seated   Other Seated Lumbar Exercises  seated on dynadisc with pelvic tilts side/side and CW/CCW.  Tactile cues to encourage Rt side tilt.       Lumbar Exercises: Prone   Opposite Arm/Leg Raise  Right arm/Left leg;Left arm/Right leg;5 reps 2 sets    Other Prone Lumbar Exercises  glute set x 5 sec x 10 reps (heavy encouragement for participation of this exercise)      Modalities  Modalities  Moist Heat;Electrical Stimulation      Moist Heat Therapy   Number Minutes Moist Heat  15 Minutes    Moist Heat Location  Lumbar Spine;Hip Rt hip      Electrical Stimulation   Electrical Stimulation Location  Rt SI/hip/LB     Electrical Stimulation Action  IFC    Electrical Stimulation Parameters  to tolerance     Electrical Stimulation Goals  Pain      Manual Therapy   Soft tissue mobilization  STM to Rt glute and hip rotators; TPR to Rt QL and psoas.     Myofascial Release  to Rt QL    Muscle Energy Technique  MET to correct high Rt ilium (in Rt sidelying with pillow under hip); MET to correct Lt sacral torsion with pt in prone                  PT Long Term Goals - 03/24/17 1258      PT LONG TERM GOAL #1   Title  Improve wt bearing through Rt LE allowing patient to stand for functional activities with equal wt bearing through bilat LE's 03/29/17    Time  6    Period  Weeks    Status  On-going WB up to 60-70%       PT LONG TERM GOAL #2   Title  Ambulate with least assistive  device with minimal pain and safe gait pattern 03/29/17    Time  6    Period  Weeks    Status  On-going      PT LONG TERM GOAL #3   Title  Decrease pain 50-75% allowing patient to perform functional activities with decreased pain and improved ADL function 03/29/17    Time  6    Period  Weeks    Status  On-going      PT LONG TERM GOAL #4   Title  Independent in HEP 03/29/17    Time  6    Period  Weeks    Status  On-going      PT LONG TERM GOAL #5   Title  Improve FOTO to </= 45% limitation 03/29/17    Time  6    Period  Weeks    Status  On-going            Plan - 03/24/17 1626    Clinical Impression Statement  Pt hesistant to try weight bearing into RLE in high kneeliing.  She tolerated all other exercises well.  Pt reporting decreased pain and improved mobility with each session.  Ionto patch last session did not stick for very long; may try another patch next visit.  Pt's pelvis symmetry looking more equal each session.  Progressing gradually towards established goals.     Rehab Potential  Good    PT Frequency  2x / week    PT Duration  6 weeks    PT Treatment/Interventions  Patient/family education;ADLs/Self Care Home Management;Cryotherapy;Electrical Stimulation;Iontophoresis 65m/ml Dexamethasone;Moist Heat;Ultrasound;Dry needling;Manual techniques;Neuromuscular re-education;Gait training;Therapeutic activities;Therapeutic exercise    PT Next Visit Plan  Continue to gradually add exercises to HEP, assess pelvis alignment.     Consulted and Agree with Plan of Care  Patient       Patient will benefit from skilled therapeutic intervention in order to improve the following deficits and impairments:  Postural dysfunction, Improper body mechanics, Abnormal gait, Decreased activity tolerance, Increased fascial restricitons, Increased muscle spasms, Decreased range of motion, Decreased mobility, Pain  Visit Diagnosis: Chronic right-sided low back pain, with sciatica presence  unspecified  Other symptoms and signs involving the musculoskeletal system  Pain in right leg  Other abnormalities of gait and mobility     Problem List Patient Active Problem List   Diagnosis Date Noted  . Palpitations   . Lyme disease   . Hair loss   . Chest pain 04/15/2016  . Abnormal stress test 04/15/2016  . Orthostatic hypotension 03/31/2016  . DYSPEPSIA 01/06/2009    Kerin Perna, PTA 03/24/17 4:29 PM  [place], [place], [address] Phone: [phone]   Fax:  [phone]  Name: Kaitlyn Faulkner MRN: [id number] Date of Birth: [birth year]

## 2017-03-29 ENCOUNTER — Encounter: Payer: Medicare Other | Admitting: Physical Therapy

## 2017-03-31 ENCOUNTER — Other Ambulatory Visit: Payer: Self-pay | Admitting: Family Medicine

## 2017-03-31 ENCOUNTER — Encounter: Payer: Medicare Other | Admitting: Physical Therapy

## 2017-03-31 ENCOUNTER — Ambulatory Visit
Admission: RE | Admit: 2017-03-31 | Discharge: 2017-03-31 | Disposition: A | Discharge status: Home / Self Care | Payer: Medicare Other | Source: Ambulatory Visit | Attending: Family Medicine | Admitting: Family Medicine

## 2017-03-31 DIAGNOSIS — K5753 Diverticulitis of both small and large intestine without perforation or abscess with bleeding: Secondary | ICD-10-CM

## 2017-03-31 DIAGNOSIS — R100 Acute abdomen: Secondary | ICD-10-CM

## 2017-03-31 MED ORDER — IOPAMIDOL (ISOVUE-300) INJECTION 61%
100.0000 mL | Freq: Once | INTRAVENOUS | Status: AC | PRN
  Administered 2017-03-31: 100 mL via INTRAVENOUS

## 2017-04-04 ENCOUNTER — Ambulatory Visit (INDEPENDENT_AMBULATORY_CARE_PROVIDER_SITE_OTHER): Payer: Medicare Other | Admitting: Physical Therapy

## 2017-04-04 DIAGNOSIS — R29898 Other symptoms and signs involving the musculoskeletal system: Secondary | ICD-10-CM

## 2017-04-04 DIAGNOSIS — R2689 Other abnormalities of gait and mobility: Secondary | ICD-10-CM | POA: Diagnosis not present

## 2017-04-04 DIAGNOSIS — M545 Low back pain: Secondary | ICD-10-CM | POA: Diagnosis not present

## 2017-04-04 DIAGNOSIS — M79604 Pain in right leg: Secondary | ICD-10-CM

## 2017-04-04 DIAGNOSIS — G8929 Other chronic pain: Secondary | ICD-10-CM | POA: Diagnosis not present

## 2017-04-04 NOTE — Addendum Note (Signed)
Addended by: Val RilesHOLT, CELYN P on: 04/04/2017 01:25 PM   Modules accepted: Orders

## 2017-04-04 NOTE — Patient Instructions (Signed)
Abduction: Side Leg Lift (Eccentric) - Side-Lying    Lie on side. Lift top leg slightly higher than shoulder level. Keep top leg straight with body, toes pointing forward. Slowly lower for 3-5 seconds. __8-10_ reps per set, __2_ sets per day   [facility] [address] [place], [place] [address]  [phone] (office) [phone] (fax)

## 2017-04-04 NOTE — Therapy (Addendum)
[place], [place], [address] Phone: [phone]   Fax:  [phone]  Physical Therapy Treatment  Patient Details  Name: Kaitlyn Faulkner MRN: [id number] Date of Birth: 01-08-1951 Referring Provider: Dr. Zonia Kief   Encounter Date: 04/04/2017  PT End of Session - 04/04/17 0934    Visit Number  9    Number of Visits  20    Date for PT Re-Evaluation  05/16/17    PT Start Time  0933    PT Stop Time  1030    PT Time Calculation (min)  57 min       Past Medical History:  Diagnosis Date  . Hair loss   . Lyme disease   . Palpitations     Past Surgical History:  Procedure Laterality Date  . DILATION AND CURETTAGE OF UTERUS  1971  . LEFT HEART CATH AND CORONARY ANGIOGRAPHY N/A 04/15/2016   Procedure: Left Heart Cath and Coronary Angiography;  Surgeon: Nelva Bush, MD;  Location: [place] [facility];  Service: Cardiovascular;  Laterality: N/A;    There were no vitals filed for this visit.  Subjective Assessment - 04/04/17 0935    Subjective  "I just know how to manage my hip pain better". She feels she has more endurance with walking, and getting stronger.  She feels she is not putting as much weight into crutches. She is fearful that doing to much in therapy will flare her hip up again.     Patient Stated Goals  walk without crutches - painfree     Currently in Pain?  Yes    Pain Score  4     Pain Location  Hip    Pain Orientation  Right    Pain Descriptors / Indicators  Dull    Aggravating Factors   prolonged standing, WB without crutches.    Pain Relieving Factors  massage, heat.          [facility] PT Assessment - 04/04/17 0001      Assessment   Medical Diagnosis  Rt lumbar; SI; LE pain     Referring Provider  Dr. Zonia Kief    Onset Date/Surgical Date  09/18/16    Hand Dominance  Right      Palpation   SI assessment   Rt ilium slightly higher than Lt.  ASIS and PSIS.      Palpation comment  pt able to tolerate medium pressure into Rt glute today, less tenderness noted.           [facility] Adult PT Treatment/Exercise - 04/04/17 0001      Lumbar Exercises: Standing   Other Standing Lumbar Exercises  weight shifts (Rt foot forward and back, side to side) onto scale to assess WB x 10 reps - up to 80% total body weight.       Lumbar Exercises: Seated   Sit to Stand  10 reps Lt foot forward, VC to slow speed      Lumbar Exercises: Sidelying   Hip Abduction  Right 2 sets of 8 reps      Knee/Hip Exercises: Standing   Forward Step Up  Right;1 set;5 reps;Hand Hold: 2;Limitations 3" step    Forward Step Up Limitations  Pt reluctant to FWB on RLE to ascend step; utilizes compensatory strategies despite encouragment and cues    Other Standing Knee Exercises  side stepping 8 ft with only light UE support on elevated high/low mat - Rt to/from Lt  with increased step height x 3 reps.        Modalities   Modalities  Electrical Stimulation;Moist Heat      Moist Heat Therapy   Number Minutes Moist Heat  15 Minutes    Moist Heat Location  Lumbar Spine;Hip Rt hip      Electrical Stimulation   Electrical Stimulation Location  Rt SI/hip/LB     Electrical Stimulation Action  IFC    Electrical Stimulation Parameters  to tolerance    Electrical Stimulation Goals  Pain      Manual Therapy   Soft tissue mobilization  STM to Rt glute and hip rotators with PROM of RLE into IR/ER; TPR to Rt psoas    Muscle Energy Technique  MET to correct high Rt ilium (in Rt sidelying with pillow under hip)             PT Education - 04/04/17 1254    Education provided  Yes    Education Details  HEP - added side stepping at counter and Rt hip abdct in sidelying.     Person(s) Educated  Patient    Methods  Explanation;Verbal cues    Comprehension  Verbalized understanding;Returned demonstration          PT Long Term Goals - 04/04/17 0949      PT LONG TERM GOAL #1   Title   Improve wt bearing through Rt LE allowing patient to stand for functional activities with equal wt bearing through bilat LE's 05/16/17    Time  6    Period  Weeks    Status  Partially Met      PT LONG TERM GOAL #2   Title  Ambulate with least assistive device with minimal pain and safe gait pattern 05/16/17    Time  6    Period  Weeks    Status  On-going improving      PT LONG TERM GOAL #3   Title  Decrease pain 50-75% allowing patient to perform functional activities with decreased pain and improved ADL function 05/16/17    Time  6    Period  Weeks    Status  On-going reporting improved ability to shower in standing.       PT LONG TERM GOAL #4   Title  Independent in HEP 05/16/17    Time  6    Period  Weeks    Status  On-going      PT LONG TERM GOAL #5   Title  Improve FOTO to </= 45% limitation 05/16/17    Time  6    Period  Weeks    Status  On-going            Plan - 04/04/17 1239    Clinical Impression Statement  Pt now able to bear 80% of body weight through RLE with min increase in Rt hip pain.  Time spent educating pt on hip musculature and importance of exercise to strengthen these weak areas; pt verbalized understanding.  Pt's pelvic alignment improving each visit.  Her FOTO score worsed to 63% limited, however verbally pt remarking on improvements during session ie: ADLs with greater ease.  Pt progressing towards goal and will benefit from continued PT intervention to maximize safety and functional mobility.     Rehab Potential  Good    PT Frequency  2x / week    PT Duration  6 weeks    PT Treatment/Interventions  Patient/family education;ADLs/Self Care Home Management;Cryotherapy;Electrical Stimulation;Iontophoresis '4mg'$ /ml  Dexamethasone;Moist Heat;Ultrasound;Dry needling;Manual techniques;Neuromuscular re-education;Gait training;Therapeutic activities;Therapeutic exercise    PT Next Visit Plan  Continue to gradually add exercises to HEP, assess pelvis alignment.         Patient will benefit from skilled therapeutic intervention in order to improve the following deficits and impairments:  Postural dysfunction, Improper body mechanics, Abnormal gait, Decreased activity tolerance, Increased fascial restricitons, Increased muscle spasms, Decreased range of motion, Decreased mobility, Pain  Visit Diagnosis: Chronic right-sided low back pain, with sciatica presence unspecified - Plan: PT plan of care cert/re-cert  Other symptoms and signs involving the musculoskeletal system - Plan: PT plan of care cert/re-cert  Pain in right leg - Plan: PT plan of care cert/re-cert  Other abnormalities of gait and mobility - Plan: PT plan of care cert/re-cert     Problem List Patient Active Problem List   Diagnosis Date Noted  . Palpitations   . Lyme disease   . Hair loss   . Chest pain 04/15/2016  . Abnormal stress test 04/15/2016  . Orthostatic hypotension 03/31/2016  . DYSPEPSIA 01/06/2009   Kerin Perna, PTA 04/04/17 1:25 PM  Celyn P. Helene Kelp PT, MPH 04/04/17 1:25 PM   [facility] [place], [place], [address] Phone: [phone]   Fax:  [phone]  Name: Shanece Cochrane MRN: [id number] Date of Birth: [birth year]

## 2017-04-07 ENCOUNTER — Ambulatory Visit (INDEPENDENT_AMBULATORY_CARE_PROVIDER_SITE_OTHER): Payer: Medicare Other | Admitting: Physical Therapy

## 2017-04-07 DIAGNOSIS — R29898 Other symptoms and signs involving the musculoskeletal system: Secondary | ICD-10-CM

## 2017-04-07 DIAGNOSIS — R2689 Other abnormalities of gait and mobility: Secondary | ICD-10-CM | POA: Diagnosis not present

## 2017-04-07 DIAGNOSIS — G8929 Other chronic pain: Secondary | ICD-10-CM

## 2017-04-07 DIAGNOSIS — M545 Low back pain: Secondary | ICD-10-CM | POA: Diagnosis present

## 2017-04-07 DIAGNOSIS — M79604 Pain in right leg: Secondary | ICD-10-CM | POA: Diagnosis not present

## 2017-04-07 NOTE — Therapy (Signed)
[place], [place], [address] Phone: [phone]   Fax:  [phone]  Physical Therapy Treatment  Patient Details  Name: Kaitlyn Faulkner MRN: [id number] Date of Birth: 1950-08-23 Referring Provider: Dr. Zonia Kief   Encounter Date: 04/07/2017  PT End of Session - 04/07/17 1027    Visit Number  10    Number of Visits  20    Date for PT Re-Evaluation  05/16/17    PT Start Time  1020    PT Stop Time  1101    PT Time Calculation (min)  41 min    Activity Tolerance  Patient tolerated treatment well    Behavior During Therapy  [facility] for tasks assessed/performed       Past Medical History:  Diagnosis Date  . Hair loss   . Lyme disease   . Palpitations     Past Surgical History:  Procedure Laterality Date  . DILATION AND CURETTAGE OF UTERUS  1971  . LEFT HEART CATH AND CORONARY ANGIOGRAPHY N/A 04/15/2016   Procedure: Left Heart Cath and Coronary Angiography;  Surgeon: Nelva Bush, MD;  Location: [place] [facility];  Service: Cardiovascular;  Laterality: N/A;    There were no vitals filed for this visit.  Subjective Assessment - 04/07/17 1027    Subjective  Pt reports she is barely putting  weight into her crutches when she walks.  She had one episode of "zapping pain" in Rt hip, but then she was fine.     Patient Stated Goals  walk without crutches - painfree     Currently in Pain?  Yes    Pain Score  2     Pain Location  Hip    Pain Orientation  Right    Pain Descriptors / Indicators  Dull         [facility] PT Assessment - 04/07/17 0001      Assessment   Medical Diagnosis  Rt lumbar; SI; LE pain     Referring Provider  Dr. Zonia Kief    Onset Date/Surgical Date  09/18/16    Hand Dominance  Right      Palpation   SI assessment   ilium appear = in standing.  Rt ASIS appears lower than Lt in supine.     Palpation comment  Point tender in Rt SI joint, Rt iscial tuberosity and QL, in  prone       [facility] Adult PT Treatment/Exercise - 04/07/17 0001      Lumbar Exercises: Standing   Other Standing Lumbar Exercises  weight shifts (Rt foot forward and back) onto scale to assess WB x 10 reps - up to 94% total body weight.  Pt initially complained of increased Rt hip pain; reduced with core engaged and focus on keeping hips level       Lumbar Exercises: Supine   Clam  10 reps each leg w/ yellow band, one leg stabilizing, other moving.    Bridge  -- with yellow band around thighs, 8 reps      Lumbar Exercises: Sidelying   Hip Abduction  Right;5 reps 2 sets      Knee/Hip Exercises: Standing   Gait Training  Gait with single axillary crutch:  3 steps with cues for crutch placement.  After seated rest, gait with single axillary crutch x 23 ft: step through pattern, slow pace, good heel strike and toe off.        Modalities   Modalities  --  pt declined; will do at home.       Manual Therapy   Soft tissue mobilization  STM to Rt glute and hip rotators with PROM of RLE into IR/ER, pin and stretch to Rt hamstrings in prone; STM and TPR to Rt QL                   PT Long Term Goals - 04/07/17 1235      PT LONG TERM GOAL #1   Title  Improve wt bearing through Rt LE allowing patient to stand for functional activities with equal wt bearing through bilat LE's 05/16/17    Time  6    Period  Weeks    Status  Partially Met      PT LONG TERM GOAL #2   Title  Ambulate with least assistive device with minimal pain and safe gait pattern 05/16/17    Time  6    Period  Weeks    Status  On-going improving      PT LONG TERM GOAL #3   Title  Decrease pain 50-75% allowing patient to perform functional activities with decreased pain and improved ADL function 05/16/17    Time  6    Period  Weeks    Status  On-going improving      PT LONG TERM GOAL #4   Title  Independent in HEP 05/16/17    Time  6    Period  Weeks    Status  On-going      PT LONG TERM GOAL #5   Title   Improve FOTO to </= 45% limitation 05/16/17    Time  6    Period  Weeks    Status  On-going            Plan - 04/07/17 1236    Clinical Impression Statement  Pt demonstrated improved WB through RLE with weight shifts; able to tolerate 94% total body weight. Pt able to ambulate short distance with single axillary crutch with minimal increase in symptoms.  Pt tolerated 5-10 reps of new exercises for hip strengthening.  Pt making good gains each session.  Pt voiced interest in possibly decreasing freq to 1x/wk in future so that she can work on HEP in between sessions and not "over do it".      Rehab Potential  Good    PT Frequency  2x / week    PT Duration  6 weeks    PT Treatment/Interventions  Patient/family education;ADLs/Self Care Home Management;Cryotherapy;Electrical Stimulation;Iontophoresis '4mg'$ /ml Dexamethasone;Moist Heat;Ultrasound;Dry needling;Manual techniques;Neuromuscular re-education;Gait training;Therapeutic activities;Therapeutic exercise    PT Next Visit Plan  Gait and stair training; Continue to gradually add exercises to HEP, assess pelvis alignment.     Consulted and Agree with Plan of Care  Patient       Patient will benefit from skilled therapeutic intervention in order to improve the following deficits and impairments:  Postural dysfunction, Improper body mechanics, Abnormal gait, Decreased activity tolerance, Increased fascial restricitons, Increased muscle spasms, Decreased range of motion, Decreased mobility, Pain  Visit Diagnosis: Chronic right-sided low back pain, with sciatica presence unspecified  Other symptoms and signs involving the musculoskeletal system  Pain in right leg  Other abnormalities of gait and mobility     Problem List Patient Active Problem List   Diagnosis Date Noted  . Palpitations   . Lyme disease   . Hair loss   . Chest pain 04/15/2016  . Abnormal stress test 04/15/2016  . Orthostatic  hypotension 03/31/2016  . DYSPEPSIA  01/06/2009   Kerin Perna, PTA 04/07/17 12:41 PM  [place], [place], [address] Phone: [phone]   Fax:  [phone]  Name: Kaitlyn Faulkner MRN: [id number] Date of Birth: [birth year]

## 2017-04-11 ENCOUNTER — Encounter: Payer: Medicare Other | Admitting: Physical Therapy

## 2017-04-12 ENCOUNTER — Encounter: Payer: Self-pay | Admitting: Rehabilitative and Restorative Service Providers"

## 2017-04-12 ENCOUNTER — Ambulatory Visit (INDEPENDENT_AMBULATORY_CARE_PROVIDER_SITE_OTHER): Payer: Medicare Other | Admitting: Rehabilitative and Restorative Service Providers"

## 2017-04-12 DIAGNOSIS — M545 Low back pain: Secondary | ICD-10-CM

## 2017-04-12 DIAGNOSIS — R29898 Other symptoms and signs involving the musculoskeletal system: Secondary | ICD-10-CM

## 2017-04-12 DIAGNOSIS — R2689 Other abnormalities of gait and mobility: Secondary | ICD-10-CM | POA: Diagnosis not present

## 2017-04-12 DIAGNOSIS — M79604 Pain in right leg: Secondary | ICD-10-CM | POA: Diagnosis not present

## 2017-04-12 DIAGNOSIS — G8929 Other chronic pain: Secondary | ICD-10-CM

## 2017-04-12 NOTE — Therapy (Signed)
[place], [place], [address] Phone: [phone]   Fax:  [phone]  Physical Therapy Treatment  Patient Details  Name: Kaitlyn Faulkner MRN: [id number] Date of Birth: 1950/08/06 Referring Provider: Dr. Zonia Kief   Encounter Date: 04/12/2017  PT End of Session - 04/12/17 1403    Visit Number  11    Number of Visits  20    Date for PT Re-Evaluation  05/16/17    PT Start Time  1401    PT Stop Time  1449    PT Time Calculation (min)  48 min    Activity Tolerance  Patient tolerated treatment well       Past Medical History:  Diagnosis Date  . Hair loss   . Lyme disease   . Palpitations     Past Surgical History:  Procedure Laterality Date  . DILATION AND CURETTAGE OF UTERUS  1971  . LEFT HEART CATH AND CORONARY ANGIOGRAPHY N/A 04/15/2016   Procedure: Left Heart Cath and Coronary Angiography;  Surgeon: Nelva Bush, MD;  Location: [facility];  Service: Cardiovascular;  Laterality: N/A;    There were no vitals filed for this visit.  Subjective Assessment - 04/12/17 1437    Subjective  May have overdone it with the hip exercises. She has had some soreness in the Rt hip. Pleased with her progress since beginning therapy. Can tell she is walking better and now putting weight on the Rt LE.     Currently in Pain?  Yes    Pain Score  4     Pain Location  Hip    Pain Orientation  Right    Pain Descriptors / Indicators  Dull    Pain Type  Chronic pain    Pain Radiating Towards  Rt hip/SI area     Pain Onset  More than a month ago    Pain Frequency  Intermittent    Aggravating Factors   prolonged standing or walking; weight bearing without crutches     Pain Relieving Factors  massage; heat; TENS                       [facility] Adult PT Treatment/Exercise - 04/12/17 0001      Lumbar Exercises: Stretches   Piriformis Stretch  Right;Left;30 seconds;3 reps supine travell  varying angles       Manual Therapy   Manual therapy comments  pt prone     Soft tissue mobilization  deep tissue work through Rt > Lt lumbar paraspinals; QL; piriformis; gluts with medium pressure     Myofascial Release  lumbar spine; Rt posterior hip                   PT Long Term Goals - 04/12/17 1403      PT LONG TERM GOAL #1   Title  Improve wt bearing through Rt LE allowing patient to stand for functional activities with equal wt bearing through bilat LE's 05/16/17    Time  6    Period  Weeks    Status  Partially Met      PT LONG TERM GOAL #2   Title  Ambulate with least assistive device with minimal pain and safe gait pattern 05/16/17    Time  6    Period  Weeks    Status  On-going      PT LONG TERM GOAL #3   Title  Decrease  pain 50-75% allowing patient to perform functional activities with decreased pain and improved ADL function 05/16/17    Time  6    Period  Weeks    Status  On-going      PT LONG TERM GOAL #4   Title  Independent in HEP 05/16/17    Time  6    Period  Weeks    Status  On-going      PT LONG TERM GOAL #5   Title  Improve FOTO to </= 45% limitation 05/16/17    Time  6    Period  Weeks    Status  On-going            Plan - 04/12/17 1442    Clinical Impression Statement  Akeria demonstrates continued improvement in gait pattern with crutches with gradually increasing weight through Rt LE. She has persistent tightness through the Rt QL. lumbar paraspinals, piriformis, gluts. Laurelin continues to progress well with rehab.     Rehab Potential  Good    PT Frequency  2x / week    PT Duration  6 weeks    PT Treatment/Interventions  Patient/family education;ADLs/Self Care Home Management;Cryotherapy;Electrical Stimulation;Iontophoresis '4mg'$ /ml Dexamethasone;Moist Heat;Ultrasound;Dry needling;Manual techniques;Neuromuscular re-education;Gait training;Therapeutic activities;Therapeutic exercise    PT Next Visit Plan  Gait and stair training;  Continue to gradually add exercises to HEP, assess pelvis alignment.     Consulted and Agree with Plan of Care  Patient       Patient will benefit from skilled therapeutic intervention in order to improve the following deficits and impairments:  Postural dysfunction, Improper body mechanics, Abnormal gait, Decreased activity tolerance, Increased fascial restricitons, Increased muscle spasms, Decreased range of motion, Decreased mobility, Pain  Visit Diagnosis: Chronic right-sided low back pain, with sciatica presence unspecified  Other symptoms and signs involving the musculoskeletal system  Pain in right leg  Other abnormalities of gait and mobility     Problem List Patient Active Problem List   Diagnosis Date Noted  . Palpitations   . Lyme disease   . Hair loss   . Chest pain 04/15/2016  . Abnormal stress test 04/15/2016  . Orthostatic hypotension 03/31/2016  . DYSPEPSIA 01/06/2009    Latasha Buczkowski Nilda Simmer PT, MPH  04/12/2017, 2:58 PM  [facility] [place], [place], [address] Phone: [phone]   Fax:  [phone]  Name: Allyssia Skluzacek MRN: [id number] Date of Birth: [birth year]

## 2017-04-14 ENCOUNTER — Ambulatory Visit (INDEPENDENT_AMBULATORY_CARE_PROVIDER_SITE_OTHER): Payer: Medicare Other | Admitting: Physical Therapy

## 2017-04-14 DIAGNOSIS — G8929 Other chronic pain: Secondary | ICD-10-CM

## 2017-04-14 DIAGNOSIS — M79604 Pain in right leg: Secondary | ICD-10-CM

## 2017-04-14 DIAGNOSIS — R2689 Other abnormalities of gait and mobility: Secondary | ICD-10-CM

## 2017-04-14 DIAGNOSIS — M545 Low back pain: Secondary | ICD-10-CM

## 2017-04-14 DIAGNOSIS — R29898 Other symptoms and signs involving the musculoskeletal system: Secondary | ICD-10-CM

## 2017-04-14 NOTE — Therapy (Signed)
[place], [place], [address] Phone: [phone]   Fax:  [phone]  Physical Therapy Treatment  Patient Details  Name: Kaitlyn Faulkner MRN: [id number] Date of Birth: [birth year] Referring Provider: Dr. Zonia Kief   Encounter Date: 04/14/2017  PT End of Session - 04/14/17 1033    Visit Number  12    Number of Visits  20    Date for PT Re-Evaluation  05/16/17    PT Start Time  2353    PT Stop Time  1101    PT Time Calculation (min)  46 min       Past Medical History:  Diagnosis Date  . Hair loss   . Lyme disease   . Palpitations     Past Surgical History:  Procedure Laterality Date  . DILATION AND CURETTAGE OF UTERUS  1971  . LEFT HEART CATH AND CORONARY ANGIOGRAPHY N/A 04/15/2016   Procedure: Left Heart Cath and Coronary Angiography;  Surgeon: Nelva Bush, MD;  Location: [place] [facility];  Service: Cardiovascular;  Laterality: N/A;    There were no vitals filed for this visit.  Subjective Assessment - 04/14/17 1035    Subjective  Pt reports she had a little bit of flare up after last session.  Had to rest for 2 days afterward.      Currently in Pain?  Yes    Pain Score  2     Pain Location  Buttocks    Pain Orientation  Right    Pain Descriptors / Indicators  Dull    Aggravating Factors   prolonged walking, over-exercising    Pain Relieving Factors  massage, heat, TENS.          [facility] PT Assessment - 04/14/17 0001      Assessment   Medical Diagnosis  Rt lumbar; SI; LE pain     Referring Provider  Dr. Zonia Kief    Onset Date/Surgical Date  09/18/16    Hand Dominance  Right      Palpation   SI assessment   pelvis appears level and ASIS are =        [facility] Adult PT Treatment/Exercise - 04/14/17 0001      Lumbar Exercises: Standing   Other Standing Lumbar Exercises  weight shifts (Rt foot forward and back) onto scale to assess WB x 10 reps -up to 100% total body  weight.  Pt reported no increase in pain with this exercise.       Lumbar Exercises: Seated   Sit to Stand  10 reps no UE assist, equal weight.     Other Seated Lumbar Exercises  seated hip walking, forward / backward x 4 reps to improve lumbar/ hip mobility.       Knee/Hip Exercises: Standing   Gait Training  slow gait training with focus on not allowing Rt pelvis to rotate backwards during toe off and instead staying more neutral. Tactile cues and VC for improved Rt hip ext during toe off with bilat crutches and progressing to single crutch.  Multiple lengths of 25 ft with standing rest breaks. Improved quality noted with increased distance and repetition.       Modalities   Modalities  -- pt declined; will do at home.       Manual Therapy   Manual therapy comments  pt prone     Soft tissue mobilization  TPR to Rt QL and lumbar paraspinals; MFR to Rt low back musculature;  STM to Rt hip; TPR with contract/ relax of Rt hip rotators                   PT Long Term Goals - 04/12/17 1403      PT LONG TERM GOAL #1   Title  Improve wt bearing through Rt LE allowing patient to stand for functional activities with equal wt bearing through bilat LE's 05/16/17    Time  6    Period  Weeks    Status  Partially Met      PT LONG TERM GOAL #2   Title  Ambulate with least assistive device with minimal pain and safe gait pattern 05/16/17    Time  6    Period  Weeks    Status  On-going      PT LONG TERM GOAL #3   Title  Decrease pain 50-75% allowing patient to perform functional activities with decreased pain and improved ADL function 05/16/17    Time  6    Period  Weeks    Status  On-going      PT LONG TERM GOAL #4   Title  Independent in HEP 05/16/17    Time  6    Period  Weeks    Status  On-going      PT LONG TERM GOAL #5   Title  Improve FOTO to </= 45% limitation 05/16/17    Time  6    Period  Weeks    Status  On-going            Plan - 04/14/17 1250    Clinical  Impression Statement  Pt reporting decreased overall pain level with functional activities in the home.  She continues to ambulate with bilat axillary crutches, mostly due to fear of flare up of Rt hip symptoms.  Gait quality improved with training including tactile cues on pelvis and VC.  Her pelvis looked symmetrical today; not MET corrections needed. Discussed decreasing freq to conserve remaining visits allowing her ample time to work on HEP (of mostly walking with single crutch) and not flare up her hip in between sessions. Pt gradually progressing towards goals.     Rehab Potential  Good    PT Frequency  2x / week    PT Duration  6 weeks    PT Treatment/Interventions  Patient/family education;ADLs/Self Care Home Management;Cryotherapy;Electrical Stimulation;Iontophoresis 18m/ml Dexamethasone;Moist Heat;Ultrasound;Dry needling;Manual techniques;Neuromuscular re-education;Gait training;Therapeutic activities;Therapeutic exercise    PT Next Visit Plan  Gait and stair training; Continue to gradually add exercises to HEP, manual as indicated.     Consulted and Agree with Plan of Care  Patient       Patient will benefit from skilled therapeutic intervention in order to improve the following deficits and impairments:  Postural dysfunction, Improper body mechanics, Abnormal gait, Decreased activity tolerance, Increased fascial restricitons, Increased muscle spasms, Decreased range of motion, Decreased mobility, Pain  Visit Diagnosis: Chronic right-sided low back pain, with sciatica presence unspecified  Other symptoms and signs involving the musculoskeletal system  Pain in right leg  Other abnormalities of gait and mobility     Problem List Patient Active Problem List   Diagnosis Date Noted  . Palpitations   . Lyme disease   . Hair loss   . Chest pain 04/15/2016  . Abnormal stress test 04/15/2016  . Orthostatic hypotension 03/31/2016  . DYSPEPSIA 01/06/2009   JKerin Perna  PTA 04/14/17 12:54 PM  [facility] [phone]  [place], [place], [address] Phone: [phone]   Fax:  [phone]  Name: Crystalynn Mcinerney MRN: [id number] Date of Birth: 23-Jan-1951

## 2017-04-19 ENCOUNTER — Encounter: Payer: Medicare Other | Admitting: Physical Therapy

## 2017-04-21 ENCOUNTER — Ambulatory Visit (INDEPENDENT_AMBULATORY_CARE_PROVIDER_SITE_OTHER): Payer: Medicare Other | Admitting: Physical Therapy

## 2017-04-21 DIAGNOSIS — M545 Low back pain: Secondary | ICD-10-CM | POA: Diagnosis not present

## 2017-04-21 DIAGNOSIS — R2689 Other abnormalities of gait and mobility: Secondary | ICD-10-CM | POA: Diagnosis not present

## 2017-04-21 DIAGNOSIS — G8929 Other chronic pain: Secondary | ICD-10-CM

## 2017-04-21 DIAGNOSIS — R29898 Other symptoms and signs involving the musculoskeletal system: Secondary | ICD-10-CM | POA: Diagnosis not present

## 2017-04-21 DIAGNOSIS — M79604 Pain in right leg: Secondary | ICD-10-CM

## 2017-04-21 NOTE — Therapy (Signed)
[place], [place], [address] Phone: [phone]   Fax:  [phone]  Physical Therapy Treatment  Patient Details  Name: Kaitlyn Faulkner MRN: [id number] Date of Birth: 1950/06/30 Referring Provider: Dr. Zonia Kief   Encounter Date: 04/21/2017  PT End of Session - 04/21/17 1147    Visit Number  13    Number of Visits  20    Date for PT Re-Evaluation  05/16/17    PT Start Time  1105    PT Stop Time  1145    PT Time Calculation (min)  40 min       Past Medical History:  Diagnosis Date  . Hair loss   . Lyme disease   . Palpitations     Past Surgical History:  Procedure Laterality Date  . DILATION AND CURETTAGE OF UTERUS  1971  . LEFT HEART CATH AND CORONARY ANGIOGRAPHY N/A 04/15/2016   Procedure: Left Heart Cath and Coronary Angiography;  Surgeon: Nelva Bush, MD;  Location: [place] [facility];  Service: Cardiovascular;  Laterality: N/A;    There were no vitals filed for this visit.  Subjective Assessment - 04/21/17 1110    Subjective  Pt reports she took a few steps without crutches and this flared her up for 2-3 days.  she has only been working on walking.  Her daughter is visiting this weekend and she does not want to flare things up.     Patient Stated Goals  walk without crutches - painfree     Currently in Pain?  Yes    Pain Score  3     Pain Location  Buttocks    Pain Orientation  Right;Left    Aggravating Factors   prolonged walking    Pain Relieving Factors  massage, heat, TENS         [facility] PT Assessment - 04/21/17 0001      Assessment   Medical Diagnosis  Rt lumbar; SI; LE pain     Referring Provider  Dr. Zonia Kief    Onset Date/Surgical Date  09/18/16    Hand Dominance  Right      Palpation   SI assessment   pelvis appears level and ASIS are =        [facility] Adult PT Treatment/Exercise - 04/21/17 0001      Lumbar Exercises: Standing   Other Standing  Lumbar Exercises  weight shifts in tandam stance without UE support, each leg forward, side to side shifts.     Other Standing Lumbar Exercises  walking with single crutch ~ 30 ft.       Lumbar Exercises: Supine   Bridge  5 reps;Limitations heavy encouragement to participate    Bridge Limitations  tactile cues for level pelvis.       Modalities   Modalities  -- pt declined; will do at home.       Manual Therapy   Soft tissue mobilization  TPR to Rt QL and lumbar paraspinals; MFR to Rt low back musculature; STM to Rt/Lt hip; TPR with contract/ relax of Rt/Lt hip rotators.  STM to bilat psoas and Lt upper quad.                   PT Long Term Goals - 04/21/17 1649      PT LONG TERM GOAL #1   Title  Improve wt bearing through Rt LE allowing patient to stand for functional activities with equal wt bearing  through bilat LE's 05/16/17    Time  6    Period  Weeks    Status  Achieved      PT LONG TERM GOAL #2   Title  Ambulate with least assistive device with minimal pain and safe gait pattern 05/16/17    Time  6    Period  Weeks    Status  Partially Met      PT LONG TERM GOAL #3   Title  Decrease pain 50-75% allowing patient to perform functional activities with decreased pain and improved ADL function 05/16/17    Time  6    Period  Weeks    Status  On-going      PT LONG TERM GOAL #4   Title  Independent in HEP 05/16/17    Time  6    Period  Weeks    Status  On-going      PT LONG TERM GOAL #5   Title  Improve FOTO to </= 45% limitation 05/16/17    Time  6    Period  Weeks    Status  On-going            Plan - 04/21/17 1644    Clinical Impression Statement  Pt hesistant to participate in gait training or exercises for fear of flaring up symptoms for next few days.  Focus was on manual therapy to address fascial tightness through hips and Rt low back.  She is able to stand with equal weight between feet and take a few steps without crutches, even though very  guarded.  Encouraged pt to return to strengthening exercises to assist in meeting goals and returned to previous level of function.  Pt has met LTG #1.     Rehab Potential  Good    PT Frequency  2x / week    PT Duration  6 weeks    PT Treatment/Interventions  Patient/family education;ADLs/Self Care Home Management;Cryotherapy;Electrical Stimulation;Iontophoresis '4mg'$ /ml Dexamethasone;Moist Heat;Ultrasound;Dry needling;Manual techniques;Neuromuscular re-education;Gait training;Therapeutic activities;Therapeutic exercise    PT Next Visit Plan  Gait and stair training; Continue to gradually add exercises to HEP, manual as indicated.        Patient will benefit from skilled therapeutic intervention in order to improve the following deficits and impairments:  Postural dysfunction, Improper body mechanics, Abnormal gait, Decreased activity tolerance, Increased fascial restricitons, Increased muscle spasms, Decreased range of motion, Decreased mobility, Pain  Visit Diagnosis: Chronic right-sided low back pain, with sciatica presence unspecified  Other symptoms and signs involving the musculoskeletal system  Pain in right leg  Other abnormalities of gait and mobility     Problem List Patient Active Problem List   Diagnosis Date Noted  . Palpitations   . Lyme disease   . Hair loss   . Chest pain 04/15/2016  . Abnormal stress test 04/15/2016  . Orthostatic hypotension 03/31/2016  . DYSPEPSIA 01/06/2009   Kerin Perna, PTA 04/21/17 4:56 PM  [place], [place], [address] Phone: [phone]   Fax:  [phone]  Name: Kaitlyn Faulkner MRN: [id number] Date of Birth: [birth year]

## 2017-04-26 ENCOUNTER — Ambulatory Visit (INDEPENDENT_AMBULATORY_CARE_PROVIDER_SITE_OTHER): Payer: Medicare Other | Admitting: Physical Therapy

## 2017-04-26 DIAGNOSIS — M79604 Pain in right leg: Secondary | ICD-10-CM | POA: Diagnosis not present

## 2017-04-26 DIAGNOSIS — G8929 Other chronic pain: Secondary | ICD-10-CM | POA: Diagnosis not present

## 2017-04-26 DIAGNOSIS — R2689 Other abnormalities of gait and mobility: Secondary | ICD-10-CM | POA: Diagnosis not present

## 2017-04-26 DIAGNOSIS — R29898 Other symptoms and signs involving the musculoskeletal system: Secondary | ICD-10-CM | POA: Diagnosis not present

## 2017-04-26 DIAGNOSIS — M545 Low back pain: Secondary | ICD-10-CM

## 2017-04-26 NOTE — Therapy (Signed)
[place], [place], [address] Phone: [phone]   Fax:  [phone]  Physical Therapy Treatment  Patient Details  Name: Kaitlyn Faulkner MRN: [id number] Date of Birth: 24-Mar-1950 Referring Provider: Dr. Zonia Kief   Encounter Date: 04/26/2017  PT End of Session - 04/26/17 1106    Visit Number  14    Number of Visits  20    Date for PT Re-Evaluation  05/16/17    PT Start Time  1020 pt arrived late    PT Stop Time  1101    PT Time Calculation (min)  41 min       Past Medical History:  Diagnosis Date  . Hair loss   . Lyme disease   . Palpitations     Past Surgical History:  Procedure Laterality Date  . DILATION AND CURETTAGE OF UTERUS  1971  . LEFT HEART CATH AND CORONARY ANGIOGRAPHY N/A 04/15/2016   Procedure: Left Heart Cath and Coronary Angiography;  Surgeon: Nelva Bush, MD;  Location: [place] [facility];  Service: Cardiovascular;  Laterality: N/A;    There were no vitals filed for this visit.  Subjective Assessment - 04/26/17 1238    Subjective  Pt reports she has only walked short distances within her house with a single crutch.  She has been doing her leg exercises again.  Her Rt hip is achy when she wakes up.     Currently in Pain?  Yes    Pain Score  4     Pain Location  Hip    Pain Orientation  Right    Pain Descriptors / Indicators  Dull;Aching    Aggravating Factors   prolonged walking    Pain Relieving Factors  massage, heat, TENS         [facility] PT Assessment - 04/26/17 0001      Assessment   Medical Diagnosis  Rt lumbar; SI; LE pain     Referring Provider  Dr. Zonia Kief    Onset Date/Surgical Date  09/18/16    Hand Dominance  Right       [facility] Adult PT Treatment/Exercise - 04/26/17 0001      Lumbar Exercises: Supine   Bridge  -- 3 reps    Bridge Limitations  tactile cues for level pelvis.       Lumbar Exercises: Sidelying   Hip Abduction  Right;10  reps VC for leading with the heel     Other Sidelying Lumbar Exercises  Rt clam - 2 reps, couldn't tolerate any more due to increased pain.       Knee/Hip Exercises: Stretches   Hip Flexor Stretch  Right;2 reps;20 seconds standing with leg extended      Knee/Hip Exercises: Standing   Hip Extension  Stengthening;Right;5 reps UE on axillary crutches.     Lateral Step Up  Right;Step Height: 2";Hand Hold: 2 step onto book, 2 reps - with encouragement    Forward Step Up  Right;2 sets;5 reps;Hand Hold: 1;Step Height: 2";Limitations Lt hand on counter, step onto book.     Forward Step Up Limitations  Pt hesistant and declined to attempt 4" step with bilat rails due to fear of flare up.     Gait Training  40 ft trials of gait training with Lt axillary crutch, VC to decrease lateral lean and bring Rt foot into midline.       Modalities   Modalities  -- declined heat and estim.  Iontophoresis   Type of Iontophoresis  Dexamethasone    Location  Rt piriformis origin     Dose  1.0 cc     Time  80 mA, 6 hr patch       Manual Therapy   Soft tissue mobilization  STM to Rt hip rotators with passive ER/IR of LE, TPR to Rt glute med                   PT Long Term Goals - 04/21/17 1649      PT LONG TERM GOAL #1   Title  Improve wt bearing through Rt LE allowing patient to stand for functional activities with equal wt bearing through bilat LE's 05/16/17    Time  6    Period  Weeks    Status  Achieved      PT LONG TERM GOAL #2   Title  Ambulate with least assistive device with minimal pain and safe gait pattern 05/16/17    Time  6    Period  Weeks    Status  Partially Met      PT LONG TERM GOAL #3   Title  Decrease pain 50-75% allowing patient to perform functional activities with decreased pain and improved ADL function 05/16/17    Time  6    Period  Weeks    Status  On-going      PT LONG TERM GOAL #4   Title  Independent in HEP 05/16/17    Time  6    Period  Weeks    Status   On-going      PT LONG TERM GOAL #5   Title  Improve FOTO to </= 45% limitation 05/16/17    Time  6    Period  Weeks    Status  On-going            Plan - 04/26/17 1234    Clinical Impression Statement  Pt continues to require heavy encouragement to try new exercises; she is hesistant to exercise due to reported fear of flare up of symptoms.  Pt resistant to gait trials with single axillary crutch, SPC, or without crutches.  Pt is making gradual gains towards remaining goals.     Rehab Potential  Good    PT Frequency  1x / week per pt request    PT Duration  6 weeks    PT Treatment/Interventions  Patient/family education;ADLs/Self Care Home Management;Cryotherapy;Electrical Stimulation;Iontophoresis '4mg'$ /ml Dexamethasone;Moist Heat;Ultrasound;Dry needling;Manual techniques;Neuromuscular re-education;Gait training;Therapeutic activities;Therapeutic exercise    PT Next Visit Plan  continue progressive strengthening for Rt hip.  add tandem stance balance if tolerated.     Consulted and Agree with Plan of Care  Patient       Patient will benefit from skilled therapeutic intervention in order to improve the following deficits and impairments:  Postural dysfunction, Improper body mechanics, Abnormal gait, Decreased activity tolerance, Increased fascial restricitons, Increased muscle spasms, Decreased range of motion, Decreased mobility, Pain  Visit Diagnosis: Chronic right-sided low back pain, with sciatica presence unspecified  Other symptoms and signs involving the musculoskeletal system  Pain in right leg  Other abnormalities of gait and mobility     Problem List Patient Active Problem List   Diagnosis Date Noted  . Palpitations   . Lyme disease   . Hair loss   . Chest pain 04/15/2016  . Abnormal stress test 04/15/2016  . Orthostatic hypotension 03/31/2016  . DYSPEPSIA 01/06/2009   Kerin Perna, PTA 04/26/17 12:40  [place], [place], [address] Phone: [phone]   Fax:  [phone]  Name: Kaitlyn Faulkner MRN: [id number] Date of Birth: [birth year]

## 2017-04-28 ENCOUNTER — Encounter: Payer: Medicare Other | Admitting: Physical Therapy

## 2017-05-03 ENCOUNTER — Encounter: Payer: Medicare Other | Admitting: Physical Therapy

## 2017-05-05 ENCOUNTER — Ambulatory Visit (INDEPENDENT_AMBULATORY_CARE_PROVIDER_SITE_OTHER): Payer: Medicare Other | Admitting: Physical Therapy

## 2017-05-05 DIAGNOSIS — R2689 Other abnormalities of gait and mobility: Secondary | ICD-10-CM

## 2017-05-05 DIAGNOSIS — R29898 Other symptoms and signs involving the musculoskeletal system: Secondary | ICD-10-CM

## 2017-05-05 DIAGNOSIS — G8929 Other chronic pain: Secondary | ICD-10-CM | POA: Diagnosis not present

## 2017-05-05 DIAGNOSIS — M545 Low back pain: Secondary | ICD-10-CM | POA: Diagnosis not present

## 2017-05-05 DIAGNOSIS — M79604 Pain in right leg: Secondary | ICD-10-CM | POA: Diagnosis not present

## 2017-05-05 NOTE — Therapy (Signed)
[facility] [place], [place], [address] Phone: [phone]   Fax:  [phone]  Physical Therapy Treatment  Patient Details  Name: Kaitlyn Faulkner MRN: [id number] Date of Birth: 1951/03/06 Referring Provider: Dr. Zonia Kief   Encounter Date: 05/05/2017  PT End of Session - 05/05/17 1629    Visit Number  15    Number of Visits  20    Date for PT Re-Evaluation  05/16/17    PT Start Time  1104    PT Stop Time  1145    PT Time Calculation (min)  41 min    Activity Tolerance  Patient tolerated treatment well    Behavior During Therapy  [facility] for tasks assessed/performed       Past Medical History:  Diagnosis Date  . Hair loss   . Lyme disease   . Palpitations     Past Surgical History:  Procedure Laterality Date  . DILATION AND CURETTAGE OF UTERUS  1971  . LEFT HEART CATH AND CORONARY ANGIOGRAPHY N/A 04/15/2016   Procedure: Left Heart Cath and Coronary Angiography;  Surgeon: Nelva Bush, MD;  Location: [facility];  Service: Cardiovascular;  Laterality: N/A;    There were no vitals filed for this visit.  Subjective Assessment - 05/05/17 1620    Subjective  Pt had flare up 1 wk ago with possibly over doing it with exercise on floor and then turning wrong in bed.  She fears she has "slipped a disc again".  Pain was a 20 out of 10 over the last week.  She took a Celebrex last night and she feels it helped break up some of the pain.  Pt now ambulating in TTWB with bilat axillary crutches.     Currently in Pain?  Yes    Pain Score  8     Pain Location  Sacrum    Pain Orientation  Right    Pain Descriptors / Indicators  Sore;Sharp;Aching    Aggravating Factors   weight bearing in RLE    Pain Relieving Factors  massage, heat, rest, non-WB         [facility] PT Assessment - 05/05/17 0001      Assessment   Medical Diagnosis  Rt lumbar; SI; LE pain     Referring Provider  Dr. Zonia Kief     Onset Date/Surgical Date  09/18/16    Hand Dominance  Right      Palpation   Palpation comment  Point tender in Rt QL;  Rt ilium elevated in sitting; ant rotated Rt ASIS; Rt sacral torsion.         [facility] Adult PT Treatment/Exercise - 05/05/17 0001      Lumbar Exercises: Supine   Bridge  5 reps      Modalities   Modalities  Ultrasound;Iontophoresis      Ultrasound   Ultrasound Location  Rt QL    Ultrasound Parameters  100%, 1.2 w/cm2, 8 min     Ultrasound Goals  Pain;Other (Comment) tone      Iontophoresis   Type of Iontophoresis  Dexamethasone    Location  Rt piriformis origin     Dose  1.0 cc    Time  80 mA, 6 hr patch       Manual Therapy   Soft tissue mobilization  STM to Rt hip rotators with passive ER/IR of LE and to glute.  MFR to Rt psoas.    Muscle Energy Technique  MET to correct high Rt ilium (in Rt sidelying with pillow under hip), 2 sets; MET to correct ant rotated Rt ilium in supine with contract relax of hamstring.         PT Long Term Goals - 04/21/17 1649      PT LONG TERM GOAL #1   Title  Improve wt bearing through Rt LE allowing patient to stand for functional activities with equal wt bearing through bilat LE's 05/16/17    Time  6    Period  Weeks    Status  Achieved      PT LONG TERM GOAL #2   Title  Ambulate with least assistive device with minimal pain and safe gait pattern 05/16/17    Time  6    Period  Weeks    Status  Partially Met      PT LONG TERM GOAL #3   Title  Decrease pain 50-75% allowing patient to perform functional activities with decreased pain and improved ADL function 05/16/17    Time  6    Period  Weeks    Status  On-going      PT LONG TERM GOAL #4   Title  Independent in HEP 05/16/17    Time  6    Period  Weeks    Status  On-going      PT LONG TERM GOAL #5   Title  Improve FOTO to </= 45% limitation 05/16/17    Time  6    Period  Weeks    Status  On-going            Plan - 05/05/17 1631    Clinical Impression  Statement  Pt has had a flare up in symptoms. Pelvis assymetries returned.  Pt not tolerant of exercise, except bridges.  Pt's alignment of pelvis improved after MET corrections. Pt reported reduction in pain at end of session.  Encouraged pt to continue working on normalized gait with axillary crutches.     Rehab Potential  Good    PT Frequency  1x / week    PT Duration  6 weeks    PT Treatment/Interventions  Patient/family education;ADLs/Self Care Home Management;Cryotherapy;Electrical Stimulation;Iontophoresis 103m/ml Dexamethasone;Moist Heat;Ultrasound;Dry needling;Manual techniques;Neuromuscular re-education;Gait training;Therapeutic activities;Therapeutic exercise    PT Next Visit Plan  continue progressive strengthening for Rt hip.  manual therapy and modalities as indicated.     Consulted and Agree with Plan of Care  Patient       Patient will benefit from skilled therapeutic intervention in order to improve the following deficits and impairments:  Postural dysfunction, Improper body mechanics, Abnormal gait, Decreased activity tolerance, Increased fascial restricitons, Increased muscle spasms, Decreased range of motion, Decreased mobility, Pain  Visit Diagnosis: Chronic right-sided low back pain, with sciatica presence unspecified  Other symptoms and signs involving the musculoskeletal system  Pain in right leg  Other abnormalities of gait and mobility     Problem List Patient Active Problem List   Diagnosis Date Noted  . Palpitations   . Lyme disease   . Hair loss   . Chest pain 04/15/2016  . Abnormal stress test 04/15/2016  . Orthostatic hypotension 03/31/2016  . DYSPEPSIA 01/06/2009   JKerin Perna PTA 05/05/17 4:35 PM  [place] [address]Phone: [phone]  Fax:  [phone] Name: Kaitlyn CorreiraMRN: [id number] of Birth: 110-Jun-1952

## 2017-05-08 ENCOUNTER — Telehealth: Payer: Self-pay | Admitting: Cardiovascular Disease

## 2017-05-08 MED ORDER — NITROGLYCERIN 0.4 MG SL SUBL: 0.4000 mg | SUBLINGUAL_TABLET | SUBLINGUAL | 6 refills | Status: DC | PRN

## 2017-05-08 NOTE — Telephone Encounter (Signed)
Received call directly from operator for patient c/o chest pain. Spoke with patient who states yesterday she started having fatigue, left side chest pain, and nausea and states today started having SOB. Patient denies recent illness, fever, or coughing. States she took propranolol x 1 yesterday and today due to feeling like HR was irregular but states it is a different feeling than the "skipping" she noticed when she wore the event monitor last fall. States she cannot report increased DOE because she has not been very active due to being on crutches for > 6 months. I asked about medication compliance and she reports she misunderstood instructions on Toprol and has not been taking it daily. I advised her to resume daily Toprol 12.5 mg as previously instructed and to use the propranolol PRN. She is aware of mild to moderate CAD from cath done 2/18 and wonders if her symptoms are coming from this; at last ov with Dr. Elease HashimotoNahser in November she denied any symptoms of angina. I offered her an appointment with one of our APPs this week because Dr. Elease HashimotoNahser is working in the hospital this week. She verbalized understanding and is scheduled to see Norma FredricksonLori Gerhardt, NP tomorrow 3/5. I spoke with Dr. Elease HashimotoNahser by telephone and he advised that patient get a Rx for SL NTG and take one to see if this relieves her pain. He advised that if her pain worsens to proceed to the ED. She verbalized understanding and agreement with plan and thanked me for the call.

## 2017-05-08 NOTE — Telephone Encounter (Signed)
New message     Pt c/o of Chest Pain: STAT if CP now or developed within 24 hours  1. Are you having CP right now?  Yes   2. Are you experiencing any other symptoms (ex. SOB, nausea, vomiting, sweating)? Fatigue , nausea and a little sob  3. How long have you been experiencing CP?  Yesterday   4. Is your CP continuous or coming and going? continuous   5. Have you taken Nitroglycerin?  Does not have any  ?

## 2017-05-09 ENCOUNTER — Telehealth: Payer: Self-pay | Admitting: Cardiovascular Disease

## 2017-05-09 ENCOUNTER — Ambulatory Visit: Payer: Medicare Other | Admitting: Nurse Practitioner

## 2017-05-09 NOTE — Telephone Encounter (Signed)
New message    Patient had to cancel appt today because she lost her ride and she is on crutches.  Thank you for arranging the appointment she is doing much better today.

## 2017-05-09 NOTE — Telephone Encounter (Signed)
Noted. Patient has an appointment for Monday 3/11 with Norma FredricksonLori Gerhardt, NP

## 2017-05-09 NOTE — Progress Notes (Deleted)
CARDIOLOGY OFFICE NOTE  Date:  05/09/2017    Kaitlyn Faulkner Date of Birth: 05-03-1950 Medical Record #[id number]  PCP:  Dois Davenport, MD  Cardiologist:  Tyrone Sage & ***    No chief complaint on file.   History of Present Illness: Kaitlyn Faulkner is a 67 y.o. female who presents today for a *** Has had Lyme disease - diagnosed with Lyme disease 2 years ago .   Is having some neuro issues  Has also been having chest pain issues Feels like "something is taking over her body" Ringing in her ears,   Gets sweats,  Nauseated,  Pressure in left side of neck, left arm pain  Can last several hours Had an episode last night  Has several episodes each week  Is not associated with standing  Occasionally is brought on my eating   Has had a stress test 2 years ago, ECGs always look fine. Has been seen in the ER several times and nothing has shown up Has had an echo  Has been to a medical intuitive doctor - who thought that she had some pulmonary issues.    Father had heart disease in his 66   Has Lyme disease .  She did not complete the course of Abx.  Caused stomach issues Is considering getting Ozone treatments at [facility]   Is vegetarian, has been losing weight  Eats 2 pea protein smoothies a day .  Not much other protein . Maybe 1/2 an egg a week.   May 16, 2016:  Kaitlyn Faulkner is seen today . We scheduled a myoview after her last visit myoview shows a subtile anterior and apical defect. She is quite thin and this is unlikely to be breast artifact. She continues to have significant symptoms.  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   Medical therapy was advised.   Has had a rough winter according to her She was having ? stroke symptoms on Feb. 16 and went to the ER.   Had some dissorientation.   Right arm numbness Had CT scan looked good  She thinks it could be Lyme disease.    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  Nov. 7, 2018:  Kaitlyn Faulkner is doing better from a cardiac standpoint. Has a back injury. Is in a wheelchair  BP has been well controlled.   Has chronic Lyme disease and has continued to have issues with palps ( which she thinks are related) .   Also has lots of stresses.    Is not walking at all - has had this back injury for months.  Seeing a chiropractor and a spine doctor  Has  Episodes of atypical CP .   Associated with being very warm and palpitations , followed by a sensation of being cold   Comes in today. Here with   Past Medical History:  Diagnosis Date  . Hair loss   . Lyme disease   . Palpitations     Past Surgical History:  Procedure Laterality Date  . DILATION AND CURETTAGE OF UTERUS  1971  . LEFT HEART CATH AND CORONARY ANGIOGRAPHY N/A 04/15/2016   Procedure: Left Heart Cath and Coronary Angiography;  Surgeon: Yvonne Kendall, MD;  Location: [facility];  Service: Cardiovascular;  Laterality: N/A;     Medications: No outpatient medications have been marked as taking for the 05/09/17 encounter (Appointment) with Tyrone Sage,  Jennet Maduro, NP.     Allergies: Allergies  Allergen Reactions  . Gluten Meal     Pt preference   . Lactose Intolerance (Gi)     Pt preference     Social History: The patient  reports that  has never smoked. she has never used smokeless tobacco. She reports that she does not drink alcohol or use drugs.   Family History: The patient's ***family history includes CAD in her father; Parkinson's disease in her mother.   Review of Systems: Please see the history of present illness.   Otherwise, the review of systems is positive for {NONE DEFAULTED:18576::"none"}.   All other systems are reviewed and negative.   Physical Exam: VS:  There were no vitals taken for this visit. Marland Kitchen  BMI There is no height or weight on file to calculate BMI.  Wt Readings from Last 3  Encounters:  01/11/17 133 lb (60.3 kg)  05/16/16 136 lb (61.7 kg)  04/15/16 135 lb (61.2 kg)    General: Pleasant. Well developed, well nourished and in no acute distress.   HEENT: Normal.  Neck: Supple, no JVD, carotid bruits, or masses noted.  Cardiac: ***Regular rate and rhythm. No murmurs, rubs, or gallops. No edema.  Respiratory:  Lungs are clear to auscultation bilaterally with normal work of breathing.  GI: Soft and nontender.  MS: No deformity or atrophy. Gait and ROM intact.  Skin: Warm and dry. Color is normal.  Neuro:  Strength and sensation are intact and no gross focal deficits noted.  Psych: Alert, appropriate and with normal affect.   LABORATORY DATA:  EKG:  EKG {ACTION; IS/IS ZOX:[id number]} ordered today. This demonstrates ***.  Lab Results  Component Value Date   WBC 5.1 10/22/2016   HGB 12.9 10/22/2016   HCT 35.7 (L) 10/22/2016   PLT 238 10/22/2016   GLUCOSE 98 01/11/2017   CHOL 180 01/11/2017   TRIG 52 01/11/2017   HDL 79 01/11/2017   LDLCALC 91 01/11/2017   ALT 10 01/11/2017   AST 17 01/11/2017   NA 138 01/11/2017   K 4.6 01/11/2017   CL 99 01/11/2017   CREATININE 0.70 01/11/2017   BUN 13 01/11/2017   CO2 25 01/11/2017   INR 0.94 04/22/2016     BNP (last 3 results) No results for input(s): BNP in the last 8760 hours.  ProBNP (last 3 results) No results for input(s): PROBNP in the last 8760 hours.   Other Studies Reviewed Today:  Event Monitor Study Highlights 12/2016    NSR  Occasional PVCs in bigeminal pattern.      Procedures   Left Heart Cath and Coronary Angiography 04/2016  Conclusion   Conclusions: 1. Mild to moderate, non-obstructive coronary artery disease including 40-50% ostial diagonal stenosis and 20% mid LAD disease. 2. 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 3. Upper normal left ventricular filling pressure.  Recommendations: 1. Aggressive lipid therapy to prevent progression of disease involving LAD  and diagonal. 2. Medical therapy of fatigue and atypical chest pain, as diagonal branch is not ideal candidate for PCI (and may not explain her symptoms). 3. Follow-up with Dr. Elease Hashimoto as an outpatient.  Yvonne Kendall, MD [facility] Pager: [phone]      Assessment/Plan:  1.  CAD :     Has a stenosis in D1.  Has not been having any angina.    2. Orthostasis:   May have dysautonomia .  She would like to see Dr. Graciela Husbands.  Marland Kitchen  I  suggested that she may want to discuss this with her  Primary MD.  Will get the monitor back and see if it would be appropriate for her to see Dr. Graciela HusbandsKlein.   2. Chronic lyme disease:      3. Palpitations:    Prelim strips shows benign PVCs   Current medicines are reviewed with the patient today.  The patient does not have concerns regarding medicines other than what has been noted above.  The following changes have been made:  See above.  Labs/ tests ordered today include:   No orders of the defined types were placed in this encounter.    Disposition:   FU with *** in {gen number 4-09:811914}0-10:310397} {Days to years:10300}.   Patient is agreeable to this plan and will call if any problems develop in the interim.   SignedNorma Fredrickson: Joelyn Lover, NP  05/09/2017 7:35 AM  [facility] [address] [place], [place]  [address] Phone: [phone] Fax: [phone]

## 2017-05-10 ENCOUNTER — Encounter: Payer: Medicare Other | Admitting: Physical Therapy

## 2017-05-15 ENCOUNTER — Ambulatory Visit: Payer: Medicare Other | Admitting: Nurse Practitioner

## 2017-05-22 ENCOUNTER — Ambulatory Visit (INDEPENDENT_AMBULATORY_CARE_PROVIDER_SITE_OTHER): Payer: Medicare Other | Admitting: Physical Therapy

## 2017-05-22 DIAGNOSIS — M79604 Pain in right leg: Secondary | ICD-10-CM

## 2017-05-22 DIAGNOSIS — R29898 Other symptoms and signs involving the musculoskeletal system: Secondary | ICD-10-CM | POA: Diagnosis not present

## 2017-05-22 DIAGNOSIS — G8929 Other chronic pain: Secondary | ICD-10-CM

## 2017-05-22 DIAGNOSIS — R2689 Other abnormalities of gait and mobility: Secondary | ICD-10-CM | POA: Diagnosis not present

## 2017-05-22 DIAGNOSIS — M545 Low back pain: Secondary | ICD-10-CM

## 2017-05-22 NOTE — Therapy (Signed)
[place], [place], [address] Phone: [phone]   Fax:  [phone]  Physical Therapy Treatment  Patient Details  Name: Kaitlyn Faulkner MRN: [id number] Date of Birth: [birth year] Referring Provider: Dr. Zonia Kief   Encounter Date: 05/22/2017  PT End of Session - 05/22/17 0938    Visit Number  16    Number of Visits  20    PT Start Time  0934    PT Stop Time  1036 MHP last 15 min    PT Time Calculation (min)  62 min       Past Medical History:  Diagnosis Date  . Hair loss   . Lyme disease   . Palpitations     Past Surgical History:  Procedure Laterality Date  . DILATION AND CURETTAGE OF UTERUS  1971  . LEFT HEART CATH AND CORONARY ANGIOGRAPHY N/A 04/15/2016   Procedure: Left Heart Cath and Coronary Angiography;  Surgeon: Nelva Bush, MD;  Location: [facility];  Service: Cardiovascular;  Laterality: N/A;    There were no vitals filed for this visit.  Subjective Assessment - 05/22/17 1015    Subjective  Pt reports she fell 1/2 way down stairs when she was getting into wheel chair in her home.  She is back to more weight bearing into RLE.      Currently in Pain?  Yes    Pain Score  3     Pain Location  Sacrum    Pain Radiating Towards  Rt hip/SI area    Aggravating Factors   sitting for extended periods, too much weight bearing     Pain Relieving Factors  massage, heat, rest.          [facility] PT Assessment - 05/22/17 0001      Assessment   Medical Diagnosis  Rt lumbar; SI; LE pain     Referring Provider  Dr. Zonia Kief    Onset Date/Surgical Date  09/18/16    Hand Dominance  Right    Next MD Visit  02/21/17      Observation/Other Assessments   Focus on Therapeutic Outcomes (FOTO)   58% limited (goal of 45%)      Palpation   Palpation comment  Rt sacral torsion; Rt elevated ilium.  Point tender Rt iliopsoas and QL.        [facility] Adult PT Treatment/Exercise -  05/22/17 0001      Lumbar Exercises: Supine   Bridge  5 reps    Other Supine Lumbar Exercises  verbally reviewed exercises (prone opp arm / leg, standing hip ext, walking program, bridging, sit to/from stand with equal wt). Pt verbalized understanding.        Moist Heat Therapy   Number Minutes Moist Heat  15 Minutes    Moist Heat Location  Lumbar Spine;Hip Rt hip      Electrical Stimulation   Electrical Stimulation Location  -- pt declined      Manual Therapy   Soft tissue mobilization  TPR to Rt QL. STM to Rt glutes, lateral Rt hip, ITB, deep Rt hip rotators.  TPR to Rt hip rotators with contract relax (in Lt sidelying)     Myofascial Release  MFR to Rt iliopsoas.     Muscle Energy Technique  MET to correct high Rt ilium (in Rt sidelying with pillow under hip), 2 sets; MET to correct ant rotated Rt ilium in supine with contract relax of hamstring.  PT Long Term Goals - 04/21/17 1649      PT LONG TERM GOAL #1   Title  Improve wt bearing through Rt LE allowing patient to stand for functional activities with equal wt bearing through bilat LE's 05/16/17    Time  6    Period  Weeks    Status  Achieved      PT LONG TERM GOAL #2   Title  Ambulate with least assistive device with minimal pain and safe gait pattern 05/16/17    Time  6    Period  Weeks    Status  Partially Met      PT LONG TERM GOAL #3   Title  Decrease pain 50-75% allowing patient to perform functional activities with decreased pain and improved ADL function 05/16/17    Time  6    Period  Weeks    Status  On-going      PT LONG TERM GOAL #4   Title  Independent in HEP 05/16/17    Time  6    Period  Weeks    Status  On-going      PT LONG TERM GOAL #5   Title  Improve FOTO to </= 45% limitation 05/16/17    Time  6    Period  Weeks    Status  On-going            Plan - 05/22/17 1334    Clinical Impression Statement  Pt is now WB more evenly into RLE, but continues to ambulate  with bilat axillary crutches.  Hip asymmetries improved with MET corrections and manual therapy. Pt's FOTO score similar to eval, however pt reports at least 50% improvement in functional mobility since intiating therapy.  Pt encouraged to continue walking and HEP to strengthening pelvis/LE musculature, thereby decreasing further injury.  Pt has partially met her goals and will benefit from continued PT intervention.     Rehab Potential  Good    PT Frequency  1x / week 1x  - every other wk    PT Duration  6 weeks    PT Treatment/Interventions  Patient/family education;ADLs/Self Care Home Management;Cryotherapy;Electrical Stimulation;Iontophoresis 98m/ml Dexamethasone;Moist Heat;Ultrasound;Dry needling;Manual techniques;Neuromuscular re-education;Gait training;Therapeutic activities;Therapeutic exercise    PT Next Visit Plan  continue progressive strengthening for Rt hip.  manual therapy and modalities as indicated.     Consulted and Agree with Plan of Care  Patient       Patient will benefit from skilled therapeutic intervention in order to improve the following deficits and impairments:  Postural dysfunction, Improper body mechanics, Abnormal gait, Decreased activity tolerance, Increased fascial restricitons, Increased muscle spasms, Decreased range of motion, Decreased mobility, Pain  Visit Diagnosis: Chronic right-sided low back pain, with sciatica presence unspecified  Other symptoms and signs involving the musculoskeletal system  Pain in right leg  Other abnormalities of gait and mobility     Problem List Patient Active Problem List   Diagnosis Date Noted  . Palpitations   . Lyme disease   . Hair loss   . Chest pain 04/15/2016  . Abnormal stress test 04/15/2016  . Orthostatic hypotension 03/31/2016  . DYSPEPSIA 01/06/2009   JKerin Perna PTA 05/22/17 1:38 PM   Celyn P. HHelene KelpPT, MPH 05/22/17 1:58 PM    [facility]  [place] [address]Phone: [phone]  Fax:  [phone] Name: Kaitlyn KinkadeMRN: [id number] of Birth: [birth year]

## 2017-06-01 ENCOUNTER — Encounter: Payer: Self-pay | Admitting: Nurse Practitioner

## 2017-06-06 ENCOUNTER — Ambulatory Visit (INDEPENDENT_AMBULATORY_CARE_PROVIDER_SITE_OTHER): Payer: Medicare Other | Admitting: Physical Therapy

## 2017-06-06 ENCOUNTER — Encounter: Payer: Self-pay | Admitting: Physical Therapy

## 2017-06-06 DIAGNOSIS — G8929 Other chronic pain: Secondary | ICD-10-CM | POA: Diagnosis not present

## 2017-06-06 DIAGNOSIS — M545 Low back pain: Secondary | ICD-10-CM | POA: Diagnosis not present

## 2017-06-06 DIAGNOSIS — M79604 Pain in right leg: Secondary | ICD-10-CM | POA: Diagnosis not present

## 2017-06-06 DIAGNOSIS — R2689 Other abnormalities of gait and mobility: Secondary | ICD-10-CM

## 2017-06-06 DIAGNOSIS — R29898 Other symptoms and signs involving the musculoskeletal system: Secondary | ICD-10-CM | POA: Diagnosis not present

## 2017-06-06 NOTE — Therapy (Signed)
[place], [place], [address] Phone: [phone]   Fax:  [phone]  Physical Therapy Treatment  Patient Details  Name: Kaitlyn Faulkner MRN: [id number] Date of Birth: Aug 18, 1950 Referring Provider: Dr. Zonia Kief   Encounter Date: 06/06/2017  PT End of Session - 06/06/17 1024    Visit Number  17    Number of Visits  20    Date for PT Re-Evaluation  07/17/17    PT Start Time  1019    PT Stop Time  1105    PT Time Calculation (min)  46 min       Past Medical History:  Diagnosis Date  . Hair loss   . Lyme disease   . Palpitations     Past Surgical History:  Procedure Laterality Date  . DILATION AND CURETTAGE OF UTERUS  1971  . LEFT HEART CATH AND CORONARY ANGIOGRAPHY N/A 04/15/2016   Procedure: Left Heart Cath and Coronary Angiography;  Surgeon: Nelva Bush, MD;  Location: [place] [facility];  Service: Cardiovascular;  Laterality: N/A;    There were no vitals filed for this visit.  Subjective Assessment - 06/06/17 1025    Subjective  Pt reports she fell off of a rolling stool yesterday.  pt reports she's interested in looking for chiropractor for ART.  She is concerned about having numbness in foot over last few days.     Patient Stated Goals  walk without crutches - painfree     Currently in Pain?  Yes    Pain Score  7     Pain Orientation  Right    Pain Descriptors / Indicators  Nagging;Dull    Pain Radiating Towards  down to Rt lateral knee    Aggravating Factors   sitting for extended periods, too much weight bearing     Pain Relieving Factors  massage, heat.          [facility] PT Assessment - 06/06/17 0001      Assessment   Medical Diagnosis  Rt lumbar; SI; LE pain     Referring Provider  Dr. Zonia Kief    Onset Date/Surgical Date  09/18/16    Hand Dominance  Right      Palpation   Palpation comment  Lt sacral torsion; Rt elevated ilium, level ASIS in standing.  Point  tender Rt ischial tuberosity, glute med, QL, and piriformis       [facility] Adult PT Treatment/Exercise - 06/06/17 0001      Lumbar Exercises: Stretches   Prone on Elbows Stretch  -- pt dclined    Press Ups  -- pt declined      Lumbar Exercises: Seated   Other Seated Lumbar Exercises  Pt given freq VC to even wt into ischial tuberosities, and upright postures (avoiding lumbar flexion and Lt weight shift.       Lumbar Exercises: Prone   Opposite Arm/Leg Raise  -- pt declined to complete    Other Prone Lumbar Exercises  glute set x 5 sec x 3 reps (heavy encouragement for participation of this exercise)      Ultrasound   Ultrasound Location  R QL & SI joint    Ultrasound Parameters  100%, 1.0 W/cm2, 1 MHz, 8 min.    Ultrasound Goals  Pain;Other (Comment) tightness      Manual Therapy   Soft tissue mobilization  TPR to Rt QL. STM to Rt glutes, lateral Rt hip, ITB, deep Rt hip rotators.  TPR to Rt hip rotators with contract relax with pt in prone    Muscle Energy Technique  MET to correct high Rt ilium (in Rt sidelying with pillow under hip), 2 sets; MET to correct Lt sacral torsion in prone.        PT Long Term Goals - 05/22/17 1355      PT LONG TERM GOAL #1   Title  Improve wt bearing through Rt LE allowing patient to stand for functional activities with equal wt bearing through bilat LE's 07/17/17    Time  6    Period  Weeks    Status  Achieved      PT LONG TERM GOAL #2   Title  Ambulate with least assistive device with minimal pain and safe gait pattern 07/17/17    Time  14    Period  Weeks    Status  Revised      PT LONG TERM GOAL #3   Title  Decrease pain 50-75% allowing patient to perform functional activities with decreased pain and improved ADL function 07/17/17    Time  14    Period  Weeks    Status  Revised      PT LONG TERM GOAL #4   Title  Independent in HEP 07/18/17    Time  14    Period  Weeks    Status  Revised      PT LONG TERM GOAL #5   Title  Improve FOTO  to </= 45% limitation 07/17/17    Time  14    Period  Weeks    Status  Revised      PT LONG TERM GOAL #6   Title  Patient ascends and descends steps safely step over step with assistive device and rails as needed 07/17/17    Time  8    Period  Weeks    Status  New            Plan - 06/06/17 1132    Clinical Impression Statement  Pt ambulating with axillary crutches with minimal weight into RLE due to fall yesterday.  Pt guarded and resistant to perform any ther ex due to fear of exacerbating symptoms.  Pt educated about importance of good posture to decrease radicular symptoms and importance of therapeutic exercise to strengthen pelvis and LE (in order to return to previous level of function).  Pt reported reduction of symptoms with manual therapy.  Pt has made no new gains towards goals due to recent fall and subsequent flare up.     Rehab Potential  Good    PT Frequency  1x / week    PT Duration  6 weeks    PT Treatment/Interventions  Patient/family education;ADLs/Self Care Home Management;Cryotherapy;Electrical Stimulation;Iontophoresis '4mg'$ /ml Dexamethasone;Moist Heat;Ultrasound;Dry needling;Manual techniques;Neuromuscular re-education;Gait training;Therapeutic activities;Therapeutic exercise    PT Next Visit Plan  continue progressive strengthening for Rt hip.  manual therapy and modalities as indicated.     Consulted and Agree with Plan of Care  Patient       Patient will benefit from skilled therapeutic intervention in order to improve the following deficits and impairments:  Postural dysfunction, Improper body mechanics, Abnormal gait, Decreased activity tolerance, Increased fascial restricitons, Increased muscle spasms, Decreased range of motion, Decreased mobility, Pain  Visit Diagnosis: Chronic right-sided low back pain, with sciatica presence unspecified  Other symptoms and signs involving the musculoskeletal system  Pain in right leg  Other abnormalities of gait and  mobility  Problem List Patient Active Problem List   Diagnosis Date Noted  . Palpitations   . Lyme disease   . Hair loss   . Chest pain 04/15/2016  . Abnormal stress test 04/15/2016  . Orthostatic hypotension 03/31/2016  . DYSPEPSIA 01/06/2009   Kerin Perna, PTA 06/06/17 2:07 PM  [place], [place], [address] Phone: [phone]   Fax:  [phone]  Name: Brionna Romanek MRN: [id number] Date of Birth: 11-16-1950

## 2017-06-07 ENCOUNTER — Ambulatory Visit: Payer: Medicare Other | Admitting: Nurse Practitioner

## 2017-06-14 ENCOUNTER — Ambulatory Visit (INDEPENDENT_AMBULATORY_CARE_PROVIDER_SITE_OTHER): Payer: Medicare Other | Admitting: Physical Therapy

## 2017-06-14 DIAGNOSIS — M79604 Pain in right leg: Secondary | ICD-10-CM | POA: Diagnosis not present

## 2017-06-14 DIAGNOSIS — R2689 Other abnormalities of gait and mobility: Secondary | ICD-10-CM

## 2017-06-14 DIAGNOSIS — M545 Low back pain: Secondary | ICD-10-CM | POA: Diagnosis not present

## 2017-06-14 DIAGNOSIS — R29898 Other symptoms and signs involving the musculoskeletal system: Secondary | ICD-10-CM

## 2017-06-14 DIAGNOSIS — G8929 Other chronic pain: Secondary | ICD-10-CM

## 2017-06-14 NOTE — Therapy (Signed)
[facility] [address] [place], [place], [address] Phone: [phone]   Fax:  [phone]  Physical Therapy Treatment  Patient Details  Name: Kaitlyn DubonnetBarbara Faulkner MRN: [id number] Date of Birth: 1950-04-21 Referring Provider: Dr. Letta Kocherhomas Saullo    Encounter Date: 06/14/2017  PT End of Session - 06/14/17 0938    Visit Number  18    Number of Visits  20    Date for PT Re-Evaluation  07/17/17    PT Start Time  0938    PT Stop Time  1016    PT Time Calculation (min)  38 min    Behavior During Therapy  [facility] for tasks assessed/performed       Past Medical History:  Diagnosis Date  . Hair loss   . Lyme disease   . Palpitations     Past Surgical History:  Procedure Laterality Date  . DILATION AND CURETTAGE OF UTERUS  1971  . LEFT HEART CATH AND CORONARY ANGIOGRAPHY N/A 04/15/2016   Procedure: Left Heart Cath and Coronary Angiography;  Surgeon: Yvonne Kendallhristopher End, MD;  [facility];  Service: Cardiovascular;  Laterality: N/A;    There were no vitals filed for this visit.  Subjective Assessment - 06/14/17 1736    Subjective  Pt ambulates into therapy with bilat axillary crutches; states she continues to need both with walking around community.  She saw a chiropractor and was pleased with their treatment.  She continues to worry about doing to much and flaring up the symptoms in her Rt hip/lowback/leg.     Patient Stated Goals  walk without crutches - painfree     Currently in Pain?  Yes    Pain Score  7     Pain Location  Sacrum    Pain Orientation  Right    Pain Descriptors / Indicators  Dull;Aching    Aggravating Factors   too much weight bearing, too much exercise, stomping into leg    Pain Relieving Factors  massage, heat.          [facility] - 06/14/17 0001      Assessment   Medical Diagnosis  Rt lumbar; SI; LE pain     Referring Provider  Dr. Letta Kocherhomas Saullo     Onset Date/Surgical Date   09/18/16    Hand Dominance  Right      Palpation   Palpation comment  Level sacrum; level ASIS in supine;   Point tender Rt ischial tuberosity, glute med, QL, and piriformis      Ambulation/Gait   Gait Comments  Pt WB ~ 50% weight into RLE (133#, WB 74# into RLE)        [facility] Adult PT Treatment/Exercise - 06/14/17 0001      Self-Care   Other Self-Care Comments   Pt educted on Rock tape rationale and safe removal technique.       Lumbar Exercises: Standing   Other Standing Lumbar Exercises  weight shifts in tandam stance with UE support on crutches      Lumbar Exercises: Seated   Sit to Stand  5 reps VC for form, neutral spine, even wt in feet      Lumbar Exercises: Supine   Bridge  5 reps      Lumbar Exercises: Prone   Straight Leg Raise  -- pt declined due to fear of increased pain    Other Prone Lumbar Exercises  TKE with glute set for RLE x 3 sec hold  x 5 reps      Modalities   Modalities  -- pt declined      Manual Therapy   Manual Therapy  Taping;Soft tissue mobilization    Soft tissue mobilization  TPR to Rt QL. STM to Rt glutes, lateral Rt hip, ITB, deep Rt hip rotators.  TPR to Rt hip rotators with contract relax with pt in prone; cross fiber friction to bilat lumbar paraspinals    Kinesiotex  Facilitate Muscle      Kinesiotix   Facilitate Muscle   X of sensitive skin rock tape applied to Rt glute med and piriformis to decrease pain, increase proprioception, facilitate muscle.                    PT Long Term Goals - 06/14/17 1742      PT LONG TERM GOAL #1   Title  Improve wt bearing through Rt LE allowing patient to stand for functional activities with equal wt bearing through bilat LE's 07/17/17    Time  6    Period  Weeks    Status  Revised revised since latest flare up.        PT LONG TERM GOAL #2   Title  Ambulate with least assistive device with minimal pain and safe gait pattern 07/17/17    Time  14    Period  Weeks    Status  On-going       PT LONG TERM GOAL #3   Title  Decrease pain 50-75% allowing patient to perform functional activities with decreased pain and improved ADL function 07/17/17    Time  14    Period  Weeks    Status  On-going dependent on level of activity in home.       PT LONG TERM GOAL #4   Title  Independent in HEP 07/18/17    Time  14    Period  Weeks    Status  On-going      PT LONG TERM GOAL #5   Title  Improve FOTO to </= 45% limitation 07/17/17    Time  14    Period  Weeks    Status  On-going      PT LONG TERM GOAL #6   Title  Patient ascends and descends steps safely step over step with assistive device and rails as needed 07/17/17    Time  8    Period  Weeks    Status  On-going            Plan - 06/14/17 1738    Clinical Impression Statement  Pt observed ambulating with increase WB through RLE than 1 wk prior.  Pt continues to be guarded and avoid Rt hip ext in any position due to reported pain and fear of flaring up symptoms. She tolerated minimal exercise during session.  Her pelvis appeared somewhat symmetrical.  Heavy encouragement given to pt to begin strengthening exercises of HEP to assist in return to previous level of function.  Trial of Sensitive skin rock tape applied to posterior Rt hip. Pt reported pain reduction in Rt hip after manual therapy and application of tape.  Pt making gradual prgress towards established therapy goals.     Rehab Potential  Good    PT Frequency  1x / week    PT Duration  6 weeks    PT Treatment/Interventions  Patient/family education;ADLs/Self Care Home Management;Cryotherapy;Electrical Stimulation;Iontophoresis 4mg /ml Dexamethasone;Moist Heat;Ultrasound;Dry needling;Manual techniques;Neuromuscular re-education;Gait training;Therapeutic activities;Therapeutic exercise  PT Next Visit Plan  continue progressive strengthening for Rt hip.  manual therapy and modalities as indicated. assess response to Rock tape. trial of step to pattern for ascending  stairs.     Consulted and Agree with Plan of Care  Patient       Patient will benefit from skilled therapeutic intervention in order to improve the following deficits and impairments:  Postural dysfunction, Improper body mechanics, Abnormal gait, Decreased activity tolerance, Increased fascial restricitons, Increased muscle spasms, Decreased range of motion, Decreased mobility, Pain  Visit Diagnosis: Chronic right-sided low back pain, with sciatica presence unspecified  Other symptoms and signs involving the musculoskeletal system  Pain in right leg  Other abnormalities of gait and mobility     Problem List Patient Active Problem List   Diagnosis Date Noted  . Palpitations   . Lyme disease   . Hair loss   . Chest pain 04/15/2016  . Abnormal stress test 04/15/2016  . Orthostatic hypotension 03/31/2016  . DYSPEPSIA 01/06/2009   Mayer Camel, PTA 06/14/17 5:46 PM  [facility] [place] [address] [place], [place], [address] Phone: [phone]   Fax:  [phone]  Name: Kaitlyn Faulkner MRN: [id number] Date of Birth: [birth year]

## 2017-06-14 NOTE — Patient Instructions (Signed)

## 2017-06-20 ENCOUNTER — Ambulatory Visit (INDEPENDENT_AMBULATORY_CARE_PROVIDER_SITE_OTHER): Payer: Medicare Other | Admitting: Physical Therapy

## 2017-06-20 ENCOUNTER — Encounter: Payer: Self-pay | Admitting: Physical Therapy

## 2017-06-20 DIAGNOSIS — G8929 Other chronic pain: Secondary | ICD-10-CM

## 2017-06-20 DIAGNOSIS — M79604 Pain in right leg: Secondary | ICD-10-CM

## 2017-06-20 DIAGNOSIS — M545 Low back pain: Secondary | ICD-10-CM

## 2017-06-20 DIAGNOSIS — R29898 Other symptoms and signs involving the musculoskeletal system: Secondary | ICD-10-CM | POA: Diagnosis not present

## 2017-06-20 DIAGNOSIS — R2689 Other abnormalities of gait and mobility: Secondary | ICD-10-CM | POA: Diagnosis not present

## 2017-06-20 NOTE — Therapy (Signed)
[place], [place], [address] Phone: [phone]   Fax:  [phone]  Physical Therapy Treatment  Patient Details  Name: Kaitlyn Faulkner MRN: [id number] Date of Birth: [birth year] Referring Provider: Dr. Zonia Kief   Encounter Date: 06/20/2017  PT End of Session - 06/20/17 1031    Visit Number  19    Number of Visits  20    Date for PT Re-Evaluation  07/17/17    PT Start Time  1022    PT Stop Time  1104    PT Time Calculation (min)  42 min       Past Medical History:  Diagnosis Date  . Hair loss   . Lyme disease   . Palpitations     Past Surgical History:  Procedure Laterality Date  . DILATION AND CURETTAGE OF UTERUS  1971  . LEFT HEART CATH AND CORONARY ANGIOGRAPHY N/A 04/15/2016   Procedure: Left Heart Cath and Coronary Angiography;  Surgeon: Nelva Bush, MD;  Location: [facility];  Service: Cardiovascular;  Laterality: N/A;    There were no vitals filed for this visit.  Subjective Assessment - 06/20/17 1126    Subjective  (Pt arrived ambulating with bilat axillary crutches).  She states she saw her chiropractor yesterday and he adjusted her sacrum.  She had relief with rock tape, however pulled it off and "took skin".   She states her main complaint is continued pain in her Rt lateral hip. She is concerned her hip is not healing from injury last summer and she worries about flaring it up with exercises.    Currently in Pain?  Yes    Pain Score  3     Pain Location  Hip    Pain Orientation  Right    Pain Descriptors / Indicators  Dull    Aggravating Factors   too much exercise    Pain Relieving Factors  massage, heat, ice         [facility] PT Assessment - 06/20/17 0001      Assessment   Medical Diagnosis  Rt lumbar; SI; LE pain     Referring Provider  Dr. Zonia Kief    Onset Date/Surgical Date  09/18/16    Hand Dominance  Right      Sensation   Additional  Comments  Pt had small scabs in a line pattern of approx 3" on Rt greater trochanter.       Palpation   Palpation comment  Rt sacral torsion; Rt elevated ilium.  Point tender Rt iliopsoas and QL.        [facility] Adult PT Treatment/Exercise - 06/20/17 0001      Lumbar Exercises: Standing   Other Standing Lumbar Exercises  weight shifts with crutches. Attempted Lt hip hike for glute med activation x 3 reps; switched to sidelying Rt hip abdct      Lumbar Exercises: Seated   Sit to Stand  -- 8 reps, 3 with Rt foot back, Lt foot forward to encourage increased weight bearing through RLE.    Other Seated Lumbar Exercises  verbally reviewed exercises for home, including standing hip extension for hip flexor stretch and hip ext lifting Rt  foot off of ground, supine bridging, prone opp arm / leg; supine hamstring stretch.       Lumbar Exercises: Sidelying   Hip Abduction  Right;5 reps      Modalities   Modalities  -- pt declined  Manual Therapy   Soft tissue mobilization  TPR to Rt QL. STM to Rt and Lt glutes, lateral Rt hip,  deep Rt hip rotators.     Muscle Energy Technique  MET to correct high Rt ilium (in Rt sidelying with pillow under hip), 2 sets; MET to correct Rt sacral torsion in prone.     Kinesiotex  Facilitate Muscle      Kinesiotix   Facilitate Muscle   X of sensitive skin rock tape applied to Rt and Lt glute med and piriformis to decrease pain, increase proprioception, facilitate muscle.   Piece applied to Rt lumbar paraspinals with 10% stretch to assist with decompression of tissue and pain relief.         PT Long Term Goals - 06/14/17 1742      PT LONG TERM GOAL #1   Title  Improve wt bearing through Rt LE allowing patient to stand for functional activities with equal wt bearing through bilat LE's 07/17/17    Time  6    Period  Weeks    Status  Revised revised since latest flare up.        PT LONG TERM GOAL #2   Title  Ambulate with least assistive device with minimal  pain and safe gait pattern 07/17/17    Time  14    Period  Weeks    Status  On-going - improving      PT LONG TERM GOAL #3   Title  Decrease pain 50-75% allowing patient to perform functional activities with decreased pain and improved ADL function 07/17/17    Time  14    Period  Weeks    Status  On-going dependent on level of activity in home.       PT LONG TERM GOAL #4   Title  Independent in HEP 07/18/17    Time  14    Period  Weeks    Status  On-going      PT LONG TERM GOAL #5   Title  Improve FOTO to </= 45% limitation 07/17/17    Time  14    Period  Weeks    Status  On-going      PT LONG TERM GOAL #6   Title  Patient ascends and descends steps safely step over step with assistive device and rails as needed 07/17/17    Time  8    Period  Weeks    Status  On-going - Pt declines to try during session.           Plan - 06/20/17 1333    Clinical Impression Statement  Pt had small scabbing on Rt posterior hip where she had ripped off Rock tape last week.  Reapplied, in adjacent area (not touching any scabs) since she had positive response last visit. Will teach self taping next visit.  Time spent educating pt regarding importance of strengthening exercises to support posture and gait.  Pt demonstrating improved gait pattern and weight bearing through RLE compared to last few visits.  Pt making gradual progress towards goals.     Rehab Potential  Good    PT Frequency  1x / week    PT Duration  6 weeks    PT Treatment/Interventions  Patient/family education;ADLs/Self Care Home Management;Cryotherapy;Electrical Stimulation;Iontophoresis '4mg'$ /ml Dexamethasone;Moist Heat;Ultrasound;Dry needling;Manual techniques;Neuromuscular re-education;Gait training;Therapeutic activities;Therapeutic exercise    Consulted and Agree with Plan of Care  Patient   Plan for next visit:  FOTO - 20th visit.  Assess readiness  to d/c to HEP.        Patient will benefit from skilled therapeutic  intervention in order to improve the following deficits and impairments:  Postural dysfunction, Improper body mechanics, Abnormal gait, Decreased activity tolerance, Increased fascial restricitons, Increased muscle spasms, Decreased range of motion, Decreased mobility, Pain  Visit Diagnosis: Chronic right-sided low back pain, with sciatica presence unspecified  Other symptoms and signs involving the musculoskeletal system  Pain in right leg  Other abnormalities of gait and mobility     Problem List Patient Active Problem List   Diagnosis Date Noted  . Palpitations   . Lyme disease   . Hair loss   . Chest pain 04/15/2016  . Abnormal stress test 04/15/2016  . Orthostatic hypotension 03/31/2016  . DYSPEPSIA 01/06/2009   Kerin Perna, PTA 06/20/17 5:11 PM  [place], [place], [address] Phone: [phone]   Fax:  [phone]  Name: Kaitlyn Faulkner MRN: [id number] Date of Birth: 1950/09/14

## 2017-07-04 ENCOUNTER — Ambulatory Visit (INDEPENDENT_AMBULATORY_CARE_PROVIDER_SITE_OTHER): Payer: Self-pay | Admitting: Orthopaedic Surgery

## 2017-07-13 ENCOUNTER — Encounter (INDEPENDENT_AMBULATORY_CARE_PROVIDER_SITE_OTHER): Payer: Self-pay | Admitting: Orthopaedic Surgery

## 2017-07-13 ENCOUNTER — Ambulatory Visit (INDEPENDENT_AMBULATORY_CARE_PROVIDER_SITE_OTHER): Payer: Medicare Other

## 2017-07-13 ENCOUNTER — Ambulatory Visit (INDEPENDENT_AMBULATORY_CARE_PROVIDER_SITE_OTHER): Payer: Medicare Other | Admitting: Orthopaedic Surgery

## 2017-07-13 DIAGNOSIS — M25551 Pain in right hip: Secondary | ICD-10-CM

## 2017-07-13 DIAGNOSIS — M25552 Pain in left hip: Secondary | ICD-10-CM

## 2017-07-13 NOTE — Progress Notes (Signed)
Office Visit Note   Patient: Kaitlyn Faulkner           Date of Birth: 05/04/1950           MRN: [id number] Visit Date: 07/13/2017              Requested by: Dois Davenport, MD [address] [place], [place] [address] PCP: Dois Davenport, MD   Assessment & Plan: Visit Diagnoses:  1. Bilateral hip pain     Plan: Impression is right hip pain suspect trochanteric bursitis.  X-rays are relatively unimpressive.  Patient declined cortisone injection today.  Home exercises were provided today.  Questions encouraged and answered.  Follow-up as needed. Total face to face encounter time was greater than 45 minutes and over half of this time was spent in counseling and/or coordination of care.  Follow-Up Instructions: Return if symptoms worsen or fail to improve.   Orders:  Orders Placed This Encounter  Procedures  . XR HIP UNILAT W OR W/O PELVIS 2-3 VIEWS LEFT  . XR HIP UNILAT W OR W/O PELVIS 2-3 VIEWS RIGHT   No orders of the defined types were placed in this encounter.     Procedures: No procedures performed   Clinical Data: No additional findings.   Subjective: Chief Complaint  Patient presents with  . Right Hip - Pain  . Left Hip - Pain    67 year old female comes in with bilateral pain worse on the right mainly lateral.  She has been using crutches since last summer due to back pain.  She has had SI joint injection with partial relief.  She denies any consistent groin pain.   Review of Systems  Constitutional: Negative.   HENT: Negative.   Eyes: Negative.   Respiratory: Negative.   Cardiovascular: Negative.   Endocrine: Negative.   Musculoskeletal: Negative.   Neurological: Negative.   Hematological: Negative.   Psychiatric/Behavioral: Negative.   All other systems reviewed and are negative.    Objective: Vital Signs: There were no vitals taken for this visit.  Physical Exam  Constitutional: She is oriented to person, place, and time.  She appears well-developed and well-nourished.  HENT:  Head: Normocephalic and atraumatic.  Eyes: EOM are normal.  Neck: Neck supple.  Pulmonary/Chest: Effort normal.  Abdominal: Soft.  Neurological: She is alert and oriented to person, place, and time.  Skin: Skin is warm. Capillary refill takes less than 2 seconds.  Psychiatric: She has a normal mood and affect. Her behavior is normal. Judgment and thought content normal.  Nursing note and vitals reviewed.   Ortho Exam Right hip exam shows tenderness palpation of the trochanteric bursa.  Negative FADIR.  Negative Faber.  Overall patient is a very anxious individual.  Negative sciatic tension. Specialty Comments:  No specialty comments available.  Imaging: Xr Hip Unilat W Or W/o Pelvis 2-3 Views Left  Result Date: 07/13/2017 No significant degenerative joint disease.  No acute or structural abnormalities  Xr Hip Unilat W Or W/o Pelvis 2-3 Views Right  Result Date: 07/13/2017 No significant degenerative joint disease.  No acute or structural abnormalities    PMFS History: Patient Active Problem List   Diagnosis Date Noted  . Palpitations   . Lyme disease   . Hair loss   . Chest pain 04/15/2016  . Abnormal stress test 04/15/2016  . Orthostatic hypotension 03/31/2016  . DYSPEPSIA 01/06/2009   Past Medical History:  Diagnosis Date  . Hair loss   . Lyme  disease   . Palpitations     Family History  Problem Relation Age of Onset  . Parkinson's disease Mother   . CAD Father     Past Surgical History:  Procedure Laterality Date  . DILATION AND CURETTAGE OF UTERUS  1971  . LEFT HEART CATH AND CORONARY ANGIOGRAPHY N/A 04/15/2016   Procedure: Left Heart Cath and Coronary Angiography;  Surgeon: Yvonne Kendall, MD;  Location: [facility];  Service: Cardiovascular;  Laterality: N/A;   Social History   Occupational History  . Not on file  Tobacco Use  . Smoking status: Never Smoker  . Smokeless tobacco: Never Used    Substance and Sexual Activity  . Alcohol use: No    Comment: quit at age 67  . Drug use: No  . Sexual activity: Not on file

## 2017-07-20 ENCOUNTER — Ambulatory Visit (INDEPENDENT_AMBULATORY_CARE_PROVIDER_SITE_OTHER): Payer: Medicare Other | Admitting: Physical Therapy

## 2017-07-20 DIAGNOSIS — G8929 Other chronic pain: Secondary | ICD-10-CM | POA: Diagnosis not present

## 2017-07-20 DIAGNOSIS — M79604 Pain in right leg: Secondary | ICD-10-CM | POA: Diagnosis not present

## 2017-07-20 DIAGNOSIS — M545 Low back pain: Secondary | ICD-10-CM | POA: Diagnosis not present

## 2017-07-20 DIAGNOSIS — R29898 Other symptoms and signs involving the musculoskeletal system: Secondary | ICD-10-CM | POA: Diagnosis not present

## 2017-07-20 NOTE — Therapy (Addendum)
[place], [place], [address] Phone: [phone]   Fax:  [phone]  Physical Therapy Treatment  Patient Details  Name: Kaitlyn Faulkner MRN: [id number] Date of Birth: 10/18/1950 Referring Provider: Dr. Zonia Kief   Encounter Date: 07/20/2017  PT End of Session - 07/20/17 0851    Visit Number  20    Number of Visits  20    PT Start Time  0849    PT Stop Time  0932    PT Time Calculation (min)  43 min    Activity Tolerance  Patient tolerated treatment well;No increased pain    Behavior During Therapy  WFL for tasks assessed/performed       Past Medical History:  Diagnosis Date  . Hair loss   . Lyme disease   . Palpitations     Past Surgical History:  Procedure Laterality Date  . DILATION AND CURETTAGE OF UTERUS  1971  . LEFT HEART CATH AND CORONARY ANGIOGRAPHY N/A 04/15/2016   Procedure: Left Heart Cath and Coronary Angiography;  Surgeon: Nelva Bush, MD;  Location: [facility];  Service: Cardiovascular;  Laterality: N/A;    There were no vitals filed for this visit.  Subjective Assessment - 07/20/17 1026    Subjective  Pt returns to therapy after 1 month away- had rescheduled appt while she had ortho doctor ck out her hip.  She reports she visited chiropractor and the adjustment he did has seemed to flare up her Rt hip.  She is afraid her pelvis is out of alignment again.     Currently in Pain?  Yes    Pain Score  4     Pain Location  Hip    Pain Orientation  Right    Pain Descriptors / Indicators  Dull    Aggravating Factors   too much exercise, walking     Pain Relieving Factors  massage, heat, ice.          [facility] PT Assessment - 07/20/17 0001      Assessment   Medical Diagnosis  Rt lumbar; SI; LE pain     Referring Provider  Dr. Zonia Kief    Onset Date/Surgical Date  09/18/16    Hand Dominance  Right      Observation/Other Assessments   Focus on  Therapeutic Outcomes (FOTO)   64% limited.       Palpation   Palpation comment  slightly elevated Rt iliac crest in standing; ASIS level.  Sacrum level; PSIS level.         [facility] PT Treatment/Exercise - 07/20/17 0001      Self-Care   Other Self-Care Comments   Pt educated on how to self assess pelvic alignment with tactile/VC for palpation and visual cue of mirror-locating ASIS and iliac crests.   Pt instructed in method of self MET correction for elevated Rt hip in Lt sidelying; unable to tolerate positioning due to increased pain in Rt SI joint.       Lumbar Exercises: Stretches   Sports administrator  Right;Left;3 reps;30 seconds    Piriformis Stretch  Right;Left;2 reps;30 seconds      Lumbar Exercises: Seated   Other Seated Lumbar Exercises  verbally reviewed exercises for home, including standing hip extension for hip flexor stretch and hip ext lifting foot off of ground, supine bridging, prone opp arm / leg; supine hamstring stretch.       Modalities   Modalities  -- pt  declined.       Manual Therapy   Soft tissue mobilization  TPR to Rt QL. STM to Rt and Lt glutes, lateral Rt hip,  deep Rt hip rotators.   Deep tissue STM to Rt hip rotators with active hip ER/IR.                   PT Long Term Goals - 07/20/17 1439      PT LONG TERM GOAL #1   Title  Improve wt bearing through Rt LE allowing patient to stand for functional activities with equal wt bearing through bilat LE's 07/17/17    Time  6    Period  Weeks    Status  Partially Met      PT LONG TERM GOAL #2   Title  Ambulate with least assistive device with minimal pain and safe gait pattern 07/17/17    Time  14    Period  Weeks    Status  Achieved      PT LONG TERM GOAL #3   Title  Decrease pain 50-75% allowing patient to perform functional activities with decreased pain and improved ADL function 07/17/17    Time  14    Period  Weeks    Status  On-going dependent on day and activity level.       PT LONG  TERM GOAL #4   Title  Independent in HEP 07/18/17    Time  14    Period  Weeks    Status  Partially Met pt reporting semi-compliance with HEP.       PT LONG TERM GOAL #5   Title  Improve FOTO to </= 45% limitation 07/17/17    Time  14    Period  Weeks    Status  Not Met      PT LONG TERM GOAL #6   Title  Patient ascends and descends steps safely step over step with assistive device and rails as needed 07/17/17    Time  8    Period  Weeks    Status  Not Met pt declined to try steps during therapy sessions.             Plan - 07/20/17 1837    Clinical Impression Statement  Pt declined to try any exercises today, due to fear of flare up of symptoms despite heavy encouragment and education regarding importance of strengthening muscles to support low back and hip.  She continues to be fearful of gait without crutches and attempting steps due to pain / spasms.  Pelvis appeared symmetrical.  Rt low back musculature tight and Rt posterior hip musculature tender with palpation.   Time spent educating pt on importance of posture, exercise, and self care to reduce pain - spasm cycle and to improve mobility.  Pt has partially met her goals; she is agreeable to d/c while she works on [employer].      Rehab Potential  Good    PT Frequency  1x / week    PT Duration  6 weeks    PT Treatment/Interventions  Patient/family education;ADLs/Self Care Home Management;Cryotherapy;Electrical Stimulation;Iontophoresis '4mg'$ /ml Dexamethasone;Moist Heat;Ultrasound;Dry needling;Manual techniques;Neuromuscular re-education;Gait training;Therapeutic activities;Therapeutic exercise    PT Next Visit Plan  spoke to supervising PT; will d/c at this time.     Consulted and Agree with Plan of Care  Patient       Patient will benefit from skilled therapeutic intervention in order to improve the following deficits and impairments:  Postural dysfunction, Improper body mechanics, Abnormal gait, Decreased activity  tolerance, Increased fascial restricitons, Increased muscle spasms, Decreased range of motion, Decreased mobility, Pain  Visit Diagnosis: Chronic right-sided low back pain, with sciatica presence unspecified  Other symptoms and signs involving the musculoskeletal system  Pain in right leg     Problem List Patient Active Problem List   Diagnosis Date Noted  . Palpitations   . Lyme disease   . Hair loss   . Chest pain 04/15/2016  . Abnormal stress test 04/15/2016  . Orthostatic hypotension 03/31/2016  . DYSPEPSIA 01/06/2009   Kerin Perna, PTA 07/20/17 6:42 PM  [facility] [place] [address] [place], [place], [address] Phone: [phone]   Fax:  [phone]  Name: Yena Tisby MRN: [id number] Date of Birth: 1950-10-21  PHYSICAL THERAPY DISCHARGE SUMMARY  Visits from Start of Care: 20  Current functional level related to goals / functional outcomes: See last progress note for discharge status   Remaining deficits: Continued pain with decreased intensity; improved mobility. Would benefit from continued PT to address problems    Education / Equipment: HEP  Plan: Patient agrees to discharge.  Patient goals were partially met. Patient is being discharged due to                                                     ?????   No additional appointments covered by insurance.   Celyn P. Helene Kelp PT, MPH 08/09/17 4:58 PM

## 2017-08-13 ENCOUNTER — Other Ambulatory Visit: Payer: Self-pay | Admitting: Family Medicine

## 2017-08-13 DIAGNOSIS — Z1231 Encounter for screening mammogram for malignant neoplasm of breast: Secondary | ICD-10-CM

## 2017-08-16 ENCOUNTER — Other Ambulatory Visit: Payer: Self-pay | Admitting: Cardiovascular Disease

## 2017-08-23 ENCOUNTER — Other Ambulatory Visit: Payer: Self-pay | Admitting: Orthopaedic Surgery

## 2017-08-23 DIAGNOSIS — M25551 Pain in right hip: Secondary | ICD-10-CM

## 2017-09-02 ENCOUNTER — Other Ambulatory Visit: Payer: Medicare Other

## 2017-09-14 ENCOUNTER — Ambulatory Visit
Admission: RE | Admit: 2017-09-14 | Discharge: 2017-09-14 | Disposition: A | Discharge status: Home / Self Care | Payer: Medicare Other | Source: Ambulatory Visit | Attending: Orthopaedic Surgery | Admitting: Orthopaedic Surgery

## 2017-09-14 DIAGNOSIS — M25551 Pain in right hip: Secondary | ICD-10-CM

## 2017-10-11 ENCOUNTER — Emergency Department (HOSPITAL_COMMUNITY)
Admission: EM | Admit: 2017-10-11 | Discharge: 2017-10-11 | Disposition: A | Discharge status: Home / Self Care | Payer: Medicare Other | Attending: Emergency Medicine | Admitting: Emergency Medicine

## 2017-10-11 ENCOUNTER — Telehealth: Payer: Self-pay | Admitting: Cardiovascular Disease

## 2017-10-11 ENCOUNTER — Other Ambulatory Visit: Payer: Self-pay

## 2017-10-11 ENCOUNTER — Encounter (HOSPITAL_COMMUNITY): Payer: Self-pay | Admitting: *Deleted

## 2017-10-11 DIAGNOSIS — I493 Ventricular premature depolarization: Secondary | ICD-10-CM | POA: Diagnosis not present

## 2017-10-11 DIAGNOSIS — Z79899 Other long term (current) drug therapy: Secondary | ICD-10-CM | POA: Diagnosis not present

## 2017-10-11 DIAGNOSIS — R002 Palpitations: Secondary | ICD-10-CM | POA: Diagnosis present

## 2017-10-11 LAB — BASIC METABOLIC PANEL
ANION GAP: 9 (ref 5–15)
BUN: 14 mg/dL (ref 8–23)
CO2: 27 mmol/L (ref 22–32)
Calcium: 9.5 mg/dL (ref 8.9–10.3)
Chloride: 99 mmol/L (ref 98–111)
Creatinine, Ser: 0.77 mg/dL (ref 0.44–1.00)
GFR calc Af Amer: >60 mL/min (ref >60)
GLUCOSE: 116 mg/dL — AB (ref 70–99)
Potassium: 4.3 mmol/L (ref 3.5–5.1)
Sodium: 135 mmol/L (ref 135–145)

## 2017-10-11 LAB — CBC
HCT: 38.5 % (ref 36.0–46.0)
HEMOGLOBIN: 13.4 g/dL (ref 12.0–15.0)
MCH: 31.5 pg (ref 26.0–34.0)
MCHC: 34.8 g/dL (ref 30.0–36.0)
MCV: 90.4 fL (ref 78.0–100.0)
Platelets: 245 10*3/uL (ref 150–400)
RBC: 4.26 MIL/uL (ref 3.87–5.11)
RDW: 11.4 % — ABNORMAL LOW (ref 11.5–15.5)
WBC: 7.3 10*3/uL (ref 4.0–10.5)

## 2017-10-11 LAB — I-STAT TROPONIN, ED: Troponin i, poc: 0 ng/mL (ref 0.00–0.08)

## 2017-10-11 NOTE — Telephone Encounter (Signed)
I note patient has been instructed to go to ER.

## 2017-10-11 NOTE — Telephone Encounter (Signed)
New Message         Pt c/o of Chest Pain: 1. Are you having CP right now? Yes 2. Are you experiencing any other symptoms (ex. SOB, nausea, vomiting, sweating)? Yes SOB/Sweating/Nausea 3. How long have you been experiencing CP? A week on and off 4. Is your CP continuous or coming and going? Yes 5. Have you taken Nitroglycerin? No

## 2017-10-11 NOTE — ED Notes (Signed)
Called pt to take back to room no answer  

## 2017-10-11 NOTE — Telephone Encounter (Signed)
Patient called in regards to present CP and minimal SOB.  She describes the pain location in her left chest, shoulder and arm.  She is also perspiring some. She has no (nausea). I recommended that she heads over to the ED for further evaluation ASAP, preferably by EMS.

## 2017-10-11 NOTE — Discharge Instructions (Addendum)
You were seen in the emergency department for frequent palpitations.  Your EKG it showed that you were having PVCs.  There was no evidence in this limited evaluation for signs of a heart attack.  We offered you further evaluation but you wish to be discharged and to follow-up with your cardiologist.  Please return if any concerning symptoms.

## 2017-10-11 NOTE — ED Triage Notes (Signed)
Pt c/o L sided CP onset x 5 days with palpitations, pt reports SOB, pt denies n/v, c/o diarrhea, pt reports x 2 loose stools in the last 24 hrs, pt A&O x4

## 2017-10-11 NOTE — ED Provider Notes (Signed)
[facility] EMERGENCY DEPARTMENT Provider Note   CSN: [id number] Arrival date & time: 10/11/17  1033     History   Chief Complaint Chief Complaint  Patient presents with  . Chest Pain    HPI Clarisse Rodriges is a 67 y.o. female.  She presents to the ER after being referred here from her cardiologist office after she called him looking for an appointment.  She has been experiencing increased palpitations over the last week.  She said she will palpate her pulse and feel a hard beat in her chest. It will cause her to have an uncomfortable sensation.  She states it has not been going fast or slow.  There is also more long-standing surface chest pain that she is been told is either costochondritis or neuropathic pain.  She is had these palpitations before and has had Holter monitors in the past.  She denies any recent change in her medications and no illness symptoms.  The history is provided by the patient.  Chest Pain   This is a recurrent problem. The current episode started more than 1 week ago. The problem occurs hourly. The problem has not changed since onset.The pain is present in the substernal region. The quality of the pain is described as heavy. The pain does not radiate. Associated symptoms include irregular heartbeat. Pertinent negatives include no abdominal pain, no cough, no diaphoresis, no dizziness, no fever, no shortness of breath, no sputum production and no vomiting. Treatments tried: magnesium. The treatment provided no relief.    Past Medical History:  Diagnosis Date  . Hair loss   . Lyme disease   . Palpitations     Patient Active Problem List   Diagnosis Date Noted  . Palpitations   . Lyme disease   . Hair loss   . Chest pain 04/15/2016  . Abnormal stress test 04/15/2016  . Orthostatic hypotension 03/31/2016  . DYSPEPSIA 01/06/2009    Past Surgical History:  Procedure Laterality Date  . DILATION AND CURETTAGE OF UTERUS  1971  . LEFT  HEART CATH AND CORONARY ANGIOGRAPHY N/A 04/15/2016   Procedure: Left Heart Cath and Coronary Angiography;  Surgeon: Yvonne Kendall, MD;  Location: [facility];  Service: Cardiovascular;  Laterality: N/A;     OB History   None      Home Medications    Prior to Admission medications   Medication Sig Start Date End Date Taking? Authorizing Provider  aspirin 325 MG tablet Take 325 mg by mouth daily as needed for moderate pain or headache.   Yes [provider]  Cholecalciferol (VITAMIN D3) 5000 units CAPS Take 5,000 Units by mouth daily.   Yes [provider]  Digestive Enzyme CAPS Take 1 capsule by mouth 2 (two) times daily.   Yes [provider]  estradiol (ESTRACE) 0.1 MG/GM vaginal cream Place 1 Applicatorful vaginally at bedtime.   Yes [provider]  gabapentin (NEURONTIN) 300 MG capsule Take 300 mg by mouth at bedtime. 09/12/17  Yes [provider]  nitroGLYCERIN (NITROSTAT) 0.4 MG SL tablet Place 1 tablet (0.4 mg total) under the tongue every 5 (five) minutes as needed for chest pain. 05/08/17 10/11/17 Yes Nahser, Deloris Ping, MD  Omega-3 Fatty Acids (FISH OIL) 1000 MG CAPS Take 2,000 mg by mouth daily.   Yes [provider]  PRESCRIPTION MEDICATION as directed. Estradiol 2mg  capsule, take 2mg s daily for 3 weeks on 1 week off    Yes [provider]  PRESCRIPTION MEDICATION as directed. Progesterone 300mg s capsule, take 300mg s daily for 3 weeks off 1 week.    Yes [provider]  propranolol (INDERAL) 10 MG tablet Take 1 tablet (10 mg total) by mouth 4 (four) times daily. (irregular heartbeat Patient taking differently: Take 10 mg by mouth 4 (four) times daily as needed (irregular heartbeat).  08/16/17  Yes Nahser, Deloris Ping, MD  vitamin E 400 UNIT capsule Take 400 Units by mouth 2 (two) times daily.   Yes [provider]  metoprolol succinate (TOPROL XL) 25 MG 24 hr tablet Take 0.5 tablets (12.5 mg total) by  mouth daily. Patient not taking: Reported on 10/11/2017 12/16/16   Nahser, Deloris Ping, MD  rosuvastatin (CRESTOR) 10 MG tablet Take 1 tablet (10 mg total) by mouth daily. Patient not taking: Reported on 10/11/2017 05/16/16 08/14/16  Nahser, Deloris Ping, MD    Family History Family History  Problem Relation Age of Onset  . Parkinson's disease Mother   . CAD Father     Social History Social History   Tobacco Use  . Smoking status: Never Smoker  . Smokeless tobacco: Never Used  Substance Use Topics  . Alcohol use: No    Comment: quit at age 87  . Drug use: No     Allergies   Gluten meal and Lactose intolerance (gi)   Review of Systems Review of Systems  Constitutional: Negative for diaphoresis and fever.  HENT: Negative for sore throat.   Eyes: Negative for visual disturbance.  Respiratory: Negative for cough, sputum production and shortness of breath.   Cardiovascular: Positive for chest pain.  Gastrointestinal: Negative for abdominal pain and vomiting.  Genitourinary: Negative for dysuria.  Musculoskeletal: Negative for neck pain.  Skin: Negative for rash.  Neurological: Negative for dizziness.     Physical Exam Updated Vital Signs BP (!) 143/57 (BP Location: Right Arm)   Pulse (!) 56   Temp 98.1 F (36.7 C) (Oral)   Resp 17   Ht 5\' 9"  (1.753 m)   Wt 61.2 kg (135 lb)   SpO2 100%   BMI 19.94 kg/m   Physical Exam  Constitutional: She is oriented to person, place, and time. She appears well-developed and well-nourished. No distress.  HENT:  Head: Normocephalic and atraumatic.  Eyes: Conjunctivae are normal.  Neck: Neck supple.  Cardiovascular: Normal rate, regular rhythm and normal pulses.  No murmur heard. Pulmonary/Chest: Effort normal and breath sounds normal. No respiratory distress.  Abdominal: Soft. There is no tenderness.  Musculoskeletal: Normal range of motion. She exhibits no edema.       Right lower leg: She exhibits no edema.       Left lower leg:  She exhibits no edema.  Neurological: She is alert and oriented to person, place, and time.  Skin: Skin is warm and dry. Capillary refill takes less than 2 seconds.  Psychiatric: She has a normal mood and affect.  Nursing note and vitals reviewed.    ED Treatments / Results  Labs (all labs ordered are listed, but only abnormal results are displayed) Labs Reviewed  BASIC METABOLIC PANEL - Abnormal; Notable for the following components:      Result Value   Glucose, Bld 116 (*)    All other components within normal limits  CBC - Abnormal; Notable for the following components:   RDW 11.4 (*)    All other components within normal limits  I-STAT TROPONIN, ED    EKG EKG Interpretation  Date/Time:  Wednesday October 11 2017 10:39:07 EDT Ventricular Rate:  69 PR Interval:  148 QRS Duration: 78 QT Interval:  360 QTC Calculation: 385 R Axis:   70 Text Interpretation:  Sinus rhythm with Premature supraventricular complexes and with occasional Premature ventricular complexes Otherwise normal ECG increased ectopy from prior 8/18 Confirmed by Meridee ScoreButler, Aarionna Germer [phone]([id number]) on 10/11/2017 12:53:51 PM   Radiology No results found.  Procedures Procedures (including critical care time)  Medications Ordered in ED Medications - No data to display   Initial Impression / Assessment and Plan / ED Course  I have reviewed the triage vital signs and the nursing notes.  Pertinent labs & imaging results that were available during my care of the patient were reviewed by me and considered in my medical decision making (see chart for details).  Clinical Course as of Oct 13 918  Wed Oct 11, 2017  1310 Reviewed the patient's labs with her.  I recommended that we also add on a magnesium as she still is having frequent PVCs.  She states she only wanted to hear that she was not having a heart attack would like to be discharged.  She understands this work-up is incomplete but she is planning on contacting her  cardiologist for an appointment and that is the reason she came here.   [MB]    Clinical Course User Index [MB] Terrilee FilesButler, Preslee Regas C, MD      Final Clinical Impressions(s) / ED Diagnoses   Final diagnoses:  Heart palpitations  PVC's (premature ventricular contractions)    ED Discharge Orders    None       Terrilee FilesButler, Tonnie Stillman C, MD 10/12/17 [phone]

## 2017-10-16 ENCOUNTER — Telehealth: Payer: Self-pay | Admitting: Cardiovascular Disease

## 2017-10-16 NOTE — Telephone Encounter (Signed)
Spoke with patient who c/o tightness in chest and increased palpitations from PVCs recently. She was seen in the ED on 8/7 and told to f/u with cardiology. States was offered an appointment for September but would like to be seen sooner. She states her electrolyte levels have been normal. I scheduled her to see Dr. Elease HashimotoNahser on 8/26 and she was very grateful for the appointment.

## 2017-10-16 NOTE — Telephone Encounter (Signed)
New Message:      Pt states she went to the ER last week for palps and chest pain. She states she is continuing to have issues with these two things. Dr. Elease HashimotoNahser has no availability and the APP's next available thing is Sept 11th. Pt states she needs to be seen sooner than that.

## 2017-10-30 ENCOUNTER — Ambulatory Visit: Payer: Medicare Other | Admitting: Cardiovascular Disease

## 2017-10-30 ENCOUNTER — Encounter: Payer: Self-pay | Admitting: Cardiovascular Disease

## 2017-10-30 ENCOUNTER — Ambulatory Visit (INDEPENDENT_AMBULATORY_CARE_PROVIDER_SITE_OTHER): Payer: Medicare Other | Admitting: Cardiovascular Disease

## 2017-10-30 VITALS — BP 112/52 | HR 41 | Ht 69.0 in | Wt 132.0 lb

## 2017-10-30 DIAGNOSIS — E782 Mixed hyperlipidemia: Secondary | ICD-10-CM | POA: Diagnosis not present

## 2017-10-30 DIAGNOSIS — I493 Ventricular premature depolarization: Secondary | ICD-10-CM

## 2017-10-30 MED ORDER — METOPROLOL SUCCINATE ER 25 MG PO TB24: 12.5000 mg | ORAL_TABLET | Freq: Every day | ORAL | 3 refills | Status: DC

## 2017-10-30 NOTE — Progress Notes (Signed)
Cardiology Office Note   Date:  10/30/2017   ID:  Kaitlyn Faulkner, DOB 08-31-1950, MRN [id number]  PCP:  Dois Davenport, MD  [name].  Cardiologist:   Kristeen Miss, MD   No chief complaint on file.  Problem List 1. Chest pain  2. Lyme disease  3. Palpitations     Kaitlyn Faulkner is a 67 y.o. female who presents for eval of chest pain  Has had Lyme disease - diagnosed with Lyme disease 2 years ago .   Is having some neuro issues  Has also been having chest pain issues Feels like "something is taking over her body" Ringing in her ears,   Gets sweats,  Nauseated,  Pressure in left side of neck, left arm pain  Can last several hours Had an episode last night  Has several episodes each week  Is not associated with standing  Occasionally is brought on my eating   Has had a stress test 2 years ago, ECGs always look fine. Has been seen in the ER several times and nothing has shown up Has had an echo  Has been to a medical intuitive doctor - who thought that she had some pulmonary issues.    Father had heart disease in his 54   Has Lyme disease .  She did not complete the course of Abx.  Caused stomach issues Is considering getting Ozone treatments at [facility]   Is vegetarian, has been losing weight  Eats 2 pea protein smoothies a day .  Not much other protein . Maybe 1/2 an egg a week.   May 16, 2016:  [name] is seen today . We scheduled a myoview after her last visit myoview shows a subtile anterior and apical defect. She is quite thin and this is unlikely to be breast artifact. She continues to have significant symptoms.  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   Medical therapy was advised.   Has had a rough winter according to her She was having ? stroke symptoms on Feb. 16 and went to the ER.   Had some dissorientation.   Right arm numbness Had CT scan looked good  She thinks it could be Lyme disease.    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  Nov. 7, 2018:  [name] is doing better from a cardiac standpoint. Has a back injury. Is in a wheelchair  BP has been well controlled.   Has chronic Lyme disease and has continued to have issues with palps ( which she thinks are related) .   Also has lots of stresses.    Is not walking at all - has had this back injury for months.  Seeing a chiropractor and a spine doctor  Has  Episodes of atypical CP .   Associated with being very warm and palpitations , followed by a sensation of being cold .   Aug. 26, 2019  [name] has been having more palpitations Takes propranolol as needed  metoprolol made her fatigued and caused some mild hypotension Having some CP , atypical  Is not taking the metoprolol   Past Medical History:  Diagnosis Date  . Hair loss   . Lyme disease   . Palpitations     Past Surgical History:  Procedure Laterality Date  . DILATION AND CURETTAGE OF UTERUS  1971  . LEFT HEART CATH AND CORONARY ANGIOGRAPHY N/A 04/15/2016   Procedure:  Left Heart Cath and Coronary Angiography;  Surgeon: Yvonne Kendallhristopher End, MD;  [facility];  Service: Cardiovascular;  Laterality: N/A;     Current Outpatient Medications  Medication Sig Dispense Refill  . aspirin 325 MG tablet Take 325 mg by mouth daily as needed for moderate pain or headache.    . Cholecalciferol (VITAMIN D3) 5000 units CAPS Take 5,000 Units by mouth daily.    . Digestive Enzyme CAPS Take 1 capsule by mouth 2 (two) times daily.    Marland Kitchen. estradiol (ESTRACE) 0.1 MG/GM vaginal cream Place 1 Applicatorful vaginally at bedtime.    . gabapentin (NEURONTIN) 300 MG capsule Take 300 mg by mouth at bedtime.  1  . Omega-3 Fatty Acids (FISH OIL) 1000 MG CAPS Take 2,000 mg by mouth daily.    Marland Kitchen. PRESCRIPTION MEDICATION as directed. Estradiol 2mg  capsule, take 2mg s daily for 3 weeks on 1 week off     . PRESCRIPTION  MEDICATION as directed. Progesterone 300mg s capsule, take 300mg s daily for 3 weeks off 1 week.     . propranolol (INDERAL) 10 MG tablet Take 1 tablet (10 mg total) by mouth 4 (four) times daily. (irregular heartbeat (Patient taking differently: Take 10 mg by mouth 4 (four) times daily as needed (irregular heartbeat). ) 120 tablet 1  . vitamin E 400 UNIT capsule Take 400 Units by mouth 2 (two) times daily.    . metoprolol succinate (TOPROL XL) 25 MG 24 hr tablet Take 0.5 tablets (12.5 mg total) by mouth daily. 45 tablet 3  . nitroGLYCERIN (NITROSTAT) 0.4 MG SL tablet Place 1 tablet (0.4 mg total) under the tongue every 5 (five) minutes as needed for chest pain. 25 tablet 6  . rosuvastatin (CRESTOR) 10 MG tablet Take 1 tablet (10 mg total) by mouth daily. (Patient not taking: Reported on 10/11/2017) 90 tablet 3   No current facility-administered medications for this visit.     Allergies:   Gluten meal and Lactose intolerance (gi)    Social History:  The patient  reports that she has never smoked. She has never used smokeless tobacco. She reports that she does not drink alcohol or use drugs.   Family History:  The patient's family history includes CAD in her father; Parkinson's disease in her mother.   ROS:  Please see the history of present illness.   Review of Systems: As her HPI, otherwise negative.    Physical Exam: Blood pressure (!) 112/52, pulse (!) 41, height 5\' 9"  (1.753 m), weight 132 lb (59.9 kg), SpO2 98 %.  GEN:  Middle age, appears fatigued  HEENT: Normal NECK: No JVD; No carotid bruits LYMPHATICS: No lymphadenopathy CARDIAC: RR, no murmurs, rubs, gallops RESPIRATORY:  Clear to auscultation without rales, wheezing or rhonchi  ABDOMEN: Soft, non-tender, non-distended MUSCULOSKELETAL:  No edema; No deformity  SKIN: Warm and dry NEUROLOGIC:  Alert and oriented x 3    EKG:  EKG is not ordered today.   Recent Labs: 01/11/2017: ALT 10 10/11/2017: BUN 14; Creatinine, Ser  0.77; Hemoglobin 13.4; Platelets 245; Potassium 4.3; Sodium 135    Lipid Panel    Component Value Date/Time   CHOL 180 01/11/2017 0847   TRIG 52 01/11/2017 0847   HDL 79 01/11/2017 0847   CHOLHDL 2.3 01/11/2017 0847   LDLCALC 91 01/11/2017 0847      Wt Readings from Last 3 Encounters:  10/30/17 132 lb (59.9 kg)  10/11/17 135 lb (61.2 kg)  01/11/17 133 lb (60.3 kg)  Other studies Reviewed: Additional studies/ records that were reviewed today include: . Review of the above records demonstrates:    ASSESSMENT AND PLAN:  1.  CAD :     Has a stenosis in D1.  Has not been having any angina.    2. Orthostasis:   May have dysautonomia .  She would like to see Dr. Graciela Husbands.  .  I suggested that she may want to discuss this with her  Primary MD.  She continues to have weakness and dizziness.  I suspect that she is not eating enough protein or eating enough salt. Urged her to increase her dietary intake of protein and sodium.  2. Chronic lyme disease:      3. Palpitations:    Prelim strips shows benign PVCs,  Restart Toprol 12.5 mg a day  Advised her to eat / drink better in order to tolerate the metoprolol  Check magnesium level  4.  Elevated triglyceride level:   Has been normal in the past.  Her last triglyceride level at her primary medical doctor's was elevated.  We will recheck it again.  She does eat a lot of rice and pasta.  She is not able to exercise because of her piriformis injury.  Current medicines are reviewed at length with the patient today.  The patient does not have concerns regarding medicines.  Labs/ tests ordered today include:   Orders Placed This Encounter  Procedures  . Magnesium  . Lipid Profile     Disposition:   FU with me in 6  Months       Kristeen Miss, MD  10/30/2017 2:23 PM    [facility] [address] [place], [place], [place]  [address] Phone: [phone]; Fax: [phone]

## 2017-10-30 NOTE — Patient Instructions (Signed)
Medication Instructions:  Your physician has recommended you make the following change in your medication:   RESTART Toprol (Metoprolol) to 12.5 mg once daily   Labwork: TODAY - magnesium, cholesterol   Testing/Procedures: None Ordered   Follow-Up: Your physician wants you to follow-up in: 6 months with Dr. Elease HashimotoNahser. You will receive a reminder letter in the mail two months in advance. If you don't receive a letter, please call our office to schedule the follow-up appointment.   If you need a refill on your cardiac medications before your next appointment, please call your pharmacy.   Thank you for choosing [facility]! Eligha BridegroomMichelle Kaimana Lurz, RN [phone]

## 2017-10-31 LAB — LIPID PANEL
CHOLESTEROL TOTAL: 193 mg/dL (ref 100–199)
Chol/HDL Ratio: 2.4 ratio (ref 0.0–4.4)
HDL: 80 mg/dL (ref >39)
LDL Calculated: 92 mg/dL (ref 0–99)
TRIGLYCERIDES: 106 mg/dL (ref 0–149)
VLDL Cholesterol Cal: 21 mg/dL (ref 5–40)

## 2017-10-31 LAB — MAGNESIUM: MAGNESIUM: 2.3 mg/dL (ref 1.6–2.3)

## 2017-11-10 ENCOUNTER — Telehealth: Payer: Self-pay | Admitting: Cardiovascular Disease

## 2017-11-10 DIAGNOSIS — R002 Palpitations: Secondary | ICD-10-CM

## 2017-11-10 DIAGNOSIS — Z136 Encounter for screening for cardiovascular disorders: Secondary | ICD-10-CM

## 2017-11-10 DIAGNOSIS — I493 Ventricular premature depolarization: Secondary | ICD-10-CM

## 2017-11-10 DIAGNOSIS — R079 Chest pain, unspecified: Secondary | ICD-10-CM

## 2017-11-10 NOTE — Telephone Encounter (Signed)
Called patient who states she is having difficulty sleeping due to a thumping in her chest. She states palpitations occur throughout day and night but lying down makes symptoms worse. States she has to sleep on her left side due to back pain/injury and symptoms seem to be worse in this position. States she wakes up during the night with chest tightness and SOB but does not note this during the day. Complains also of a deep ache in her back that also induces nausea during the night. We discussed having her take an additional Toprol 12.5 mg in the evening about 1 hour before bedtime. I advised that she will need to monitor HR and BP prior to taking this additional dose. She states HR is typically 60 bpm but sometimes it is difficult to count due to frequent PVCs. She asked questions about various tests that could be done to help determine the cause of the symptoms. She states Dr. Elease Hashimoto mentioned possible referral to EP.  I explained multiple tests that can be done for various cardiac diseases. Her last echo was January 2018 and her LHC was 2/18. Patient requests to have another echo done and I answered questions about the structures of the heart that are visualized on echo. We discussed that additional tests may need to be ordered due to various symptoms she is having. I advised one possible test might be a AAA ultrasound. I advised that I will review symptoms with Dr. Elease Hashimoto and call her back next week with is advice. She verbalized understanding and agreement and thanked me for the call.

## 2017-11-10 NOTE — Telephone Encounter (Signed)
New Message        Patient is calling to discuss some issues with her heart.

## 2017-11-13 NOTE — Telephone Encounter (Signed)
Orders placed in epic

## 2017-11-13 NOTE — Telephone Encounter (Signed)
Spoke with patient and reviewed Dr. Harvie Bridge advice with her. She verbalized understanding and agreement with plan and reports that she continues to feel poor. She states she had to stop metoprolol due to low HR and BP. States she is taking propranolol as needed. Complains that chest feels tight and she feels like she has a lot of inflammation. I advised that as testing is completed we will notify her or results. I advised her to notify us of any changes in symptoms as her symptoms are somewhat unclear and we want to provide the best plan of care for her. She verbalized understanding and agreement and thanked me for the call.

## 2017-11-13 NOTE — Telephone Encounter (Signed)
Difficult to know exactly where to focus our attention.   Numerous issues Lets get   1.   48  hour holder to assess PVC burden 2.  Abdominal Duplex to evaluate for AAA - ( back pain )  3.  Echocardiogram .  To reassess LV function .

## 2017-11-15 ENCOUNTER — Ambulatory Visit (INDEPENDENT_AMBULATORY_CARE_PROVIDER_SITE_OTHER): Payer: Medicare Other

## 2017-11-15 DIAGNOSIS — R002 Palpitations: Secondary | ICD-10-CM | POA: Diagnosis not present

## 2017-11-15 DIAGNOSIS — R079 Chest pain, unspecified: Secondary | ICD-10-CM | POA: Diagnosis not present

## 2017-11-15 DIAGNOSIS — I493 Ventricular premature depolarization: Secondary | ICD-10-CM

## 2017-11-17 ENCOUNTER — Other Ambulatory Visit: Payer: Self-pay | Admitting: Cardiovascular Disease

## 2017-11-17 DIAGNOSIS — Z136 Encounter for screening for cardiovascular disorders: Secondary | ICD-10-CM

## 2017-11-17 DIAGNOSIS — R109 Unspecified abdominal pain: Secondary | ICD-10-CM

## 2017-11-21 ENCOUNTER — Other Ambulatory Visit: Payer: Self-pay

## 2017-11-21 ENCOUNTER — Ambulatory Visit (HOSPITAL_COMMUNITY): Payer: Medicare Other | Attending: Cardiology

## 2017-11-21 DIAGNOSIS — I2510 Atherosclerotic heart disease of native coronary artery without angina pectoris: Secondary | ICD-10-CM | POA: Insufficient documentation

## 2017-11-21 DIAGNOSIS — Z136 Encounter for screening for cardiovascular disorders: Secondary | ICD-10-CM

## 2017-11-21 DIAGNOSIS — R002 Palpitations: Secondary | ICD-10-CM

## 2017-11-21 DIAGNOSIS — I493 Ventricular premature depolarization: Secondary | ICD-10-CM

## 2017-11-21 DIAGNOSIS — R079 Chest pain, unspecified: Secondary | ICD-10-CM | POA: Diagnosis not present

## 2017-11-21 DIAGNOSIS — E785 Hyperlipidemia, unspecified: Secondary | ICD-10-CM | POA: Insufficient documentation

## 2017-11-21 DIAGNOSIS — I083 Combined rheumatic disorders of mitral, aortic and tricuspid valves: Secondary | ICD-10-CM | POA: Diagnosis not present

## 2017-11-22 ENCOUNTER — Other Ambulatory Visit (HOSPITAL_COMMUNITY): Payer: Medicare Other

## 2017-11-24 ENCOUNTER — Other Ambulatory Visit (HOSPITAL_COMMUNITY): Payer: Medicare Other

## 2017-11-28 ENCOUNTER — Encounter

## 2017-11-28 ENCOUNTER — Ambulatory Visit: Payer: Medicare Other | Admitting: Cardiology

## 2017-12-05 ENCOUNTER — Institutional Professional Consult (permissible substitution): Payer: Medicare Other | Admitting: Internal Medicine

## 2017-12-07 ENCOUNTER — Other Ambulatory Visit: Payer: Self-pay | Admitting: Cardiovascular Disease

## 2017-12-07 ENCOUNTER — Ambulatory Visit (HOSPITAL_COMMUNITY)
Admission: RE | Admit: 2017-12-07 | Discharge: 2017-12-07 | Disposition: A | Discharge status: Home / Self Care | Payer: Medicare Other | Source: Ambulatory Visit | Attending: Cardiology | Admitting: Cardiology

## 2017-12-07 DIAGNOSIS — Z136 Encounter for screening for cardiovascular disorders: Secondary | ICD-10-CM | POA: Diagnosis not present

## 2017-12-07 DIAGNOSIS — R1900 Intra-abdominal and pelvic swelling, mass and lump, unspecified site: Secondary | ICD-10-CM | POA: Diagnosis present

## 2017-12-07 DIAGNOSIS — R109 Unspecified abdominal pain: Secondary | ICD-10-CM | POA: Diagnosis not present

## 2018-01-03 ENCOUNTER — Institutional Professional Consult (permissible substitution): Payer: Medicare Other | Admitting: Internal Medicine

## 2018-01-22 ENCOUNTER — Encounter: Payer: Self-pay | Admitting: Physical Therapy

## 2018-01-22 ENCOUNTER — Ambulatory Visit (INDEPENDENT_AMBULATORY_CARE_PROVIDER_SITE_OTHER): Payer: Medicare Other | Admitting: Physical Therapy

## 2018-01-22 DIAGNOSIS — M5441 Lumbago with sciatica, right side: Secondary | ICD-10-CM

## 2018-01-22 DIAGNOSIS — M79604 Pain in right leg: Secondary | ICD-10-CM | POA: Diagnosis not present

## 2018-01-22 DIAGNOSIS — R29898 Other symptoms and signs involving the musculoskeletal system: Secondary | ICD-10-CM | POA: Diagnosis not present

## 2018-01-22 DIAGNOSIS — R2689 Other abnormalities of gait and mobility: Secondary | ICD-10-CM | POA: Diagnosis not present

## 2018-01-22 DIAGNOSIS — G8929 Other chronic pain: Secondary | ICD-10-CM | POA: Diagnosis not present

## 2018-01-22 NOTE — Therapy (Signed)
[facility] [place] [address] [place], [place], [address] Phone: [phone]   Fax:  [phone]  Physical Therapy Evaluation  Patient Details  Name: Kaitlyn Faulkner MRN: [id number] Date of Birth: 04/25/1950 Referring Provider (PT): Sharolyn Douglas, MD   Encounter Date: 01/22/2018  PT End of Session - 01/22/18 1123    Visit Number  1    Number of Visits  12    Date for PT Re-Evaluation  03/05/18    Authorization Type  Medicare; [employer]    PT Start Time  0930    PT Stop Time  1014    PT Time Calculation (min)  44 min    Activity Tolerance  Patient tolerated treatment well    Behavior During Therapy  [facility] for tasks assessed/performed       Past Medical History:  Diagnosis Date  . Hair loss   . Lyme disease   . Palpitations     Past Surgical History:  Procedure Laterality Date  . DILATION AND CURETTAGE OF UTERUS  1971  . LEFT HEART CATH AND CORONARY ANGIOGRAPHY N/A 04/15/2016   Procedure: Left Heart Cath and Coronary Angiography;  Surgeon: Yvonne Kendall, MD;  Location: [facility] INVASIVE CV LAB;  Service: Cardiovascular;  Laterality: N/A;    There were no vitals filed for this visit.   Subjective Assessment - 01/22/18 0936    Subjective  Pt is a 67 y/o female who presents to OPPT for LBP which began about 1.5 years ago.  Pt has been using crutches since this time.  Pt at this time is a little unsure why she is here, but has decided to return as she still has visits remaining from insurance company    Pertinent History  lyme disease, initial back injury ~ 15 years ago    Limitations  Walking    Diagnostic tests  MRI: Rt hip WNL    Patient Stated Goals  improve pain, improve function, get rid of crutches    Currently in Pain?  Yes    Pain Score  6    up to 10/10; at best 4/10   Pain Location  Back    Pain Orientation  Lower;Right    Pain Descriptors / Indicators  Burning;Shooting    Pain Type  Chronic pain    Pain  Radiating Towards  RLE to foot    Pain Onset  More than a month ago    Pain Frequency  Constant    Aggravating Factors   walking, twisting to Lt    Pain Relieving Factors  ice    Effect of Pain on Daily Activities  guarded in movement         [facility] PT Assessment - 01/22/18 0948      Assessment   Medical Diagnosis  LBP    Referring Provider (PT)  Sharolyn Douglas, MD    Onset Date/Surgical Date  --   1.5 years ago; chronic > 20 years   Next MD Visit  PRN    Prior Therapy  at this clinic previously this year      Precautions   Precautions  None      Restrictions   Weight Bearing Restrictions  No      Balance Screen   Has the patient fallen in the past 6 months  No    Has the patient had a decrease in activity level because of a fear of falling?   No    Is the patient  reluctant to leave their home because of a fear of falling?   No      Home Environment   Living Environment  Private residence    Living Arrangements  Spouse/significant other    Type of Home  House    Home Access  Stairs to enter    Entrance Stairs-Number of Steps  8   from basement; goes up on butt   Home Layout  Two level   split level   Alternate Level Stairs-Number of Steps  8   goes up sitting   Home Equipment  Crutches;Wheelchair - manual    Additional Comments  uses office chair upstairs for mobility, and w/c downstairs for mobility      Prior Function   Level of Independence  Requires assistive device for independence   sits for ADLs   Vocation  Unemployed    Leisure  write letters, read, watch tennis; walking (not currently)      Cognition   Overall Cognitive Status  Within Functional Limits for tasks assessed      Observation/Other Assessments   Focus on Therapeutic Outcomes (FOTO)   not completed due to time constraints      Posture/Postural Control   Posture/Postural Control  Postural limitations    Postural Limitations  Rounded Shoulders;Forward head;Right pelvic obliquity      ROM /  Strength   AROM / PROM / Strength  AROM;Strength      AROM   Overall AROM Comments  attempted lumbar ROM but pt refused all testing except side bending due to fear of pain    AROM Assessment Site  Lumbar    Lumbar - Right Side Bend  limited 90%    Lumbar - Left Side Bend  limited 90%      Strength   Overall Strength Comments  not tested due to pt refusal      Flexibility   Soft Tissue Assessment /Muscle Length  yes    Hamstrings  mild tightness bil    Piriformis  tightness bil      Palpation   SI assessment   mild Rt PSIS elevation    Palpation comment  pain with very light palpation Rt glute med and QL, pain at Rt SIJ      Special Tests    Special Tests  Lumbar    Lumbar Tests  Straight Leg Raise      Straight Leg Raise   Findings  Negative      Ambulation/Gait   Ambulation/Gait  Yes    Ambulation/Gait Assistance  6: Modified independent (Device/Increase time)    Ambulation Distance (Feet)  100 Feet    Assistive device  Crutches    Gait Pattern  Step-to pattern;Decreased stance time - right;Decreased step length - left;Decreased hip/knee flexion - right    Gait Comments  decr weight bearing on Rt                Objective measurements completed on examination: See above findings.      [facility] Adult PT Treatment/Exercise - 01/22/18 0948      Manual Therapy   Manual Therapy  Soft tissue mobilization    Manual therapy comments  pt prone    Soft tissue mobilization  very light IASTM to Rt QL and glute med                  PT Long Term Goals - 01/22/18 1128      PT LONG TERM GOAL #1  Title  independent with HEP    Status  New    Target Date  03/05/18      PT LONG TERM GOAL #2   Title  Ambulate with least assistive device with minimal pain and safe gait pattern     Status  New    Target Date  03/05/18      PT LONG TERM GOAL #3   Title  report ability to stand for 50% of ADLs with pain < 5/10 for improved function    Status  New    Target  Date  03/05/18      PT LONG TERM GOAL #4   Title  negotiate stairs with supervision and LRAD for improved mobility    Status  New    Target Date  03/05/18      PT LONG TERM GOAL #5   Title  n/a             Plan - 01/22/18 1124    Clinical Impression Statement  Pt is a 67 y/o female who presents to OPPT for chronic LBP.  Eval limited mainly due to pt's refusal to perform tasks due to fear of pain.  Pt demonstrates limitations with ROM, strength, ambulation as well as trigger points.  Pt will benefit from PT to address deficits listed.  Pt very guarded with all activity and progress may be limited due to willingness to participate.    History and Personal Factors relevant to plan of care:  lyme disease, chronic back pain    Clinical Presentation  Evolving    Clinical Presentation due to:  chronic pain with minimal willingness to participate; fearful of injury    Clinical Decision Making  Moderate    Rehab Potential  Good    PT Frequency  2x / week    PT Duration  6 weeks    PT Treatment/Interventions  ADLs/Self Care Home Management;Cryotherapy;Electrical Stimulation;Ultrasound;Traction;Moist Heat;Gait training;DME Instruction;Stair training;Functional mobility training;Therapeutic activities;Therapeutic exercise;Balance training;Neuromuscular re-education;Patient/family education;Orthotic Fit/Training;Manual techniques;Passive range of motion;Dry needling;Taping    PT Next Visit Plan  establish HEP for gentle stretching/strengthening, manual/modalities/taping?    Consulted and Agree with Plan of Care  Patient       Patient will benefit from skilled therapeutic intervention in order to improve the following deficits and impairments:  Abnormal gait, Increased fascial restricitons, Increased muscle spasms, Postural dysfunction, Decreased strength, Decreased range of motion, Difficulty walking, Decreased balance, Decreased activity tolerance, Decreased endurance, Decreased mobility,  Impaired flexibility  Visit Diagnosis: Chronic right-sided low back pain with right-sided sciatica - Plan: PT plan of care cert/re-cert  Other symptoms and signs involving the musculoskeletal system - Plan: PT plan of care cert/re-cert  Pain in right leg - Plan: PT plan of care cert/re-cert  Other abnormalities of gait and mobility - Plan: PT plan of care cert/re-cert     Problem List Patient Active Problem List   Diagnosis Date Noted  . Palpitations   . Lyme disease   . Hair loss   . Chest pain 04/15/2016  . Abnormal stress test 04/15/2016  . Orthostatic hypotension 03/31/2016  . DYSPEPSIA 01/06/2009      Clarita Crane, PT, DPT 01/22/18 11:33 AM    [facility] [address] [place], [place], [address] Phone: [phone]   Fax:  [phone]  Name: Najah Liverman MRN: [id number] Date of Birth: April 30, 1950

## 2018-01-26 ENCOUNTER — Ambulatory Visit (INDEPENDENT_AMBULATORY_CARE_PROVIDER_SITE_OTHER): Payer: Medicare Other | Admitting: Physical Therapy

## 2018-01-26 ENCOUNTER — Encounter: Payer: Self-pay | Admitting: Physical Therapy

## 2018-01-26 DIAGNOSIS — R2689 Other abnormalities of gait and mobility: Secondary | ICD-10-CM

## 2018-01-26 DIAGNOSIS — M79604 Pain in right leg: Secondary | ICD-10-CM | POA: Diagnosis not present

## 2018-01-26 DIAGNOSIS — G8929 Other chronic pain: Secondary | ICD-10-CM

## 2018-01-26 DIAGNOSIS — R29898 Other symptoms and signs involving the musculoskeletal system: Secondary | ICD-10-CM | POA: Diagnosis present

## 2018-01-26 DIAGNOSIS — M5441 Lumbago with sciatica, right side: Secondary | ICD-10-CM | POA: Diagnosis not present

## 2018-01-26 NOTE — Therapy (Signed)
[place] [facility] [place], [place], [address] Phone: [phone]   Fax:  [phone]  Physical Therapy Treatment  Patient Details  Name: Kaitlyn Faulkner MRN: [id number] Date of Birth: 03-Dec-1950 Referring Provider (PT): Rennis Harding, MD   Encounter Date: 01/26/2018  PT End of Session - 01/26/18 1223    Visit Number  2    Number of Visits  12    Date for PT Re-Evaluation  03/05/18    Authorization Type  Medicare; [employer]    PT Start Time  1030    PT Stop Time  1117    PT Time Calculation (min)  47 min    Activity Tolerance  Patient tolerated treatment well    Behavior During Therapy  [facility] for tasks assessed/performed       Past Medical History:  Diagnosis Date  . Hair loss   . Lyme disease   . Palpitations     Past Surgical History:  Procedure Laterality Date  . DILATION AND CURETTAGE OF UTERUS  1971  . LEFT HEART CATH AND CORONARY ANGIOGRAPHY N/A 04/15/2016   Procedure: Left Heart Cath and Coronary Angiography;  Surgeon: Nelva Bush, MD;  Location: [facility];  Service: Cardiovascular;  Laterality: N/A;    There were no vitals filed for this visit.  Subjective Assessment - 01/26/18 1033    Subjective  doing okay today.  pain is 6/10 today    Patient Stated Goals  improve pain, improve function, get rid of crutches    Currently in Pain?  Yes    Pain Score  6     Pain Location  Back    Pain Orientation  Right;Lower    Pain Descriptors / Indicators  Shooting;Burning    Pain Type  Chronic pain    Pain Onset  More than a month ago    Pain Frequency  Constant    Aggravating Factors   walking, twisting to Lt    Pain Relieving Factors  ice    Effect of Pain on Daily Activities  guarded in movement                       [facility] Adult PT Treatment/Exercise - 01/26/18 0001      Self-Care   Self-Care  Other Self-Care Comments    Other Self-Care Comments   extended  discussion with pt on anatomy and causes of pain      Exercises   Exercises  Lumbar      Lumbar Exercises: Stretches   Double Knee to Chest Stretch  3 reps;30 seconds    Double Knee to Chest Stretch Limitations  performed 1 rep after attempting ab set      Lumbar Exercises: Supine   AB Set Limitations  attempted but pt unable to tolerate as it "flared up" the lateral hip    Bridge  5 reps;5 seconds    Bridge Limitations  partial bridge with increase in lateral hip pain      Modalities   Modalities  Ultrasound;Cryotherapy      Cryotherapy   Number Minutes Cryotherapy  4 Minutes   timer set for 10 min; pt tolerated 4 min   Cryotherapy Location  Hip    Type of Cryotherapy  Ice pack      Ultrasound   Ultrasound Location  Rt lateral hip/glute    Ultrasound Parameters  50% DC, 51mz freq, 1.2 w/cm2 x 8 min; after ~  6 min pt requested to stop U/S in piriformis area as it was getting "flared up" by the u/s so avoided this area for remainder of time    Ultrasound Goals  Pain      Manual Therapy   Manual Therapy  Soft tissue mobilization    Manual therapy comments  pt Lt sidelying    Soft tissue mobilization  very gentle STM to Rt glutes and piriformis             PT Education - 01/26/18 1223    Education Details  see self care    Person(s) Educated  Patient    Methods  Explanation    Comprehension  Verbalized understanding          PT Long Term Goals - 01/22/18 1128      PT LONG TERM GOAL #1   Title  independent with HEP    Status  New    Target Date  03/05/18      PT LONG TERM GOAL #2   Title  Ambulate with least assistive device with minimal pain and safe gait pattern     Status  New    Target Date  03/05/18      PT LONG TERM GOAL #3   Title  report ability to stand for 50% of ADLs with pain < 5/10 for improved function    Status  New    Target Date  03/05/18      PT LONG TERM GOAL #4   Title  negotiate stairs with supervision and LRAD for improved  mobility    Status  New    Target Date  03/05/18      PT LONG TERM GOAL #5   Title  n/a            Plan - 01/26/18 1224    Clinical Impression Statement  Pt with limited tolerance to light activity and frequently stopping exercises due to "flare ups." Ultrasound and manual therapy limited today as well due to c/o pain increase.  Pt requested cryotherapy at end of session, then after 4 min left room and stopped cryotherapy.  No goals met today.    Rehab Potential  Good    PT Frequency  2x / week    PT Duration  6 weeks    PT Treatment/Interventions  ADLs/Self Care Home Management;Cryotherapy;Electrical Stimulation;Ultrasound;Traction;Moist Heat;Gait training;DME Instruction;Stair training;Functional mobility training;Therapeutic activities;Therapeutic exercise;Balance training;Neuromuscular re-education;Patient/family education;Orthotic Fit/Training;Manual techniques;Passive range of motion;Dry needling;Taping    PT Next Visit Plan  establish HEP for gentle stretching/strengthening, manual/modalities/taping?    Consulted and Agree with Plan of Care  Patient       Patient will benefit from skilled therapeutic intervention in order to improve the following deficits and impairments:  Abnormal gait, Increased fascial restricitons, Increased muscle spasms, Postural dysfunction, Decreased strength, Decreased range of motion, Difficulty walking, Decreased balance, Decreased activity tolerance, Decreased endurance, Decreased mobility, Impaired flexibility  Visit Diagnosis: Other symptoms and signs involving the musculoskeletal system  Pain in right leg  Other abnormalities of gait and mobility  Chronic right-sided low back pain with right-sided sciatica     Problem List Patient Active Problem List   Diagnosis Date Noted  . Palpitations   . Lyme disease   . Hair loss   . Chest pain 04/15/2016  . Abnormal stress test 04/15/2016  . Orthostatic hypotension 03/31/2016  . DYSPEPSIA  01/06/2009       Laureen Abrahams, PT, DPT 01/26/18 12:28 PM  [place], [place], [address] Phone: [phone]   Fax:  [phone]  Name: Kaitlyn Faulkner MRN: [id number] Date of Birth: 1950/06/03

## 2018-01-29 ENCOUNTER — Ambulatory Visit (INDEPENDENT_AMBULATORY_CARE_PROVIDER_SITE_OTHER): Payer: Medicare Other | Admitting: Physical Therapy

## 2018-01-29 ENCOUNTER — Encounter: Payer: Self-pay | Admitting: Physical Therapy

## 2018-01-29 DIAGNOSIS — M79604 Pain in right leg: Secondary | ICD-10-CM | POA: Diagnosis not present

## 2018-01-29 DIAGNOSIS — G8929 Other chronic pain: Secondary | ICD-10-CM | POA: Diagnosis not present

## 2018-01-29 DIAGNOSIS — R29898 Other symptoms and signs involving the musculoskeletal system: Secondary | ICD-10-CM | POA: Diagnosis present

## 2018-01-29 DIAGNOSIS — R2689 Other abnormalities of gait and mobility: Secondary | ICD-10-CM

## 2018-01-29 DIAGNOSIS — M5441 Lumbago with sciatica, right side: Secondary | ICD-10-CM

## 2018-01-29 NOTE — Patient Instructions (Signed)
Access Code: [email or web address]  URL: [email or web address]  Date: 01/29/2018  Prepared by: Moshe CiproStephanie Jaymere Alen   Exercises  Hooklying Isometric Hip Abduction with Belt - 10 reps - 1 sets - 5 sec hold - 1x daily - 7x weekly  Supine Hip Adduction Isometric with Ball - 10 reps - 1 sets - 5 sec hold - 2x daily - 7x weekly  Hooklying Isometric Hip Flexion - 10 reps - 1 sets - 5 sec hold - 1x daily - 7x weekly  Supine 90/90 Single Leg Isometric Hip Extension - 10 reps - 1 sets - 5 sec hold - 1x daily - 7x weekly

## 2018-01-29 NOTE — Therapy (Addendum)
[place], [place], [address] Phone: [phone]   Fax:  [phone]  Physical Therapy Treatment/Discharge  Patient Details  Name: Kaitlyn Faulkner MRN: [id number] Date of Birth: 24-Apr-1950 Referring Provider (PT): Rennis Harding, MD   Encounter Date: 01/29/2018  PT End of Session - 01/29/18 1321    Visit Number  3    Number of Visits  12    Date for PT Re-Evaluation  03/05/18    Authorization Type  Medicare; [employer]    PT Start Time  1102    PT Stop Time  1146    PT Time Calculation (min)  44 min    Activity Tolerance  Patient tolerated treatment well    Behavior During Therapy  [facility] for tasks assessed/performed       Past Medical History:  Diagnosis Date  . Hair loss   . Lyme disease   . Palpitations     Past Surgical History:  Procedure Laterality Date  . DILATION AND CURETTAGE OF UTERUS  1971  . LEFT HEART CATH AND CORONARY ANGIOGRAPHY N/A 04/15/2016   Procedure: Left Heart Cath and Coronary Angiography;  Surgeon: Nelva Bush, MD;  Location: [facility];  Service: Cardiovascular;  Laterality: N/A;    There were no vitals filed for this visit.  Subjective Assessment - 01/29/18 1105    Subjective  flared up after last session, thinks it was due to ultrasound.  but doing better and pain is down today.    Pertinent History  lyme disease, initial back injury ~ 15 years ago    Diagnostic tests  MRI: Rt hip WNL    Patient Stated Goals  improve pain, improve function, get rid of crutches    Pain Score  4     Pain Location  Buttocks    Pain Orientation  Right    Pain Descriptors / Indicators  Aching;Burning;Shooting    Pain Type  Chronic pain    Pain Onset  More than a month ago    Pain Frequency  Constant    Aggravating Factors   walking, twisting to Lt    Pain Relieving Factors  ice         [facility] PT Assessment - 01/29/18 1320      Assessment   Medical Diagnosis   LBP    Referring Provider (PT)  Rennis Harding, MD      Special Tests    Special Tests  Leg LengthTest    Leg length test   True      True   Length  cm    Right  90.6 in.    Left   91.4 in.                   [facility] Adult PT Treatment/Exercise - 01/29/18 1118      Exercises   Exercises  Lumbar      Lumbar Exercises: Stretches   Double Knee to Chest Stretch  2 reps;30 seconds    Quadruped Mid Back Stretch  3 reps;20 seconds      Lumbar Exercises: Supine   Isometric Hip Flexion  10 reps;5 seconds   bil alternating   Other Supine Lumbar Exercises  isometric hip abdct/er x 10 reps into strap    Other Supine Lumbar Exercises  ball squeeze 5 x 10 sec; isometric hip extension 10 x 5 sec      Manual Therapy   Manual Therapy  Soft  tissue mobilization    Manual therapy comments  pt Lt sidelying    Soft tissue mobilization  very gentle STM to Rt glutes and piriformis             PT Education - 01/29/18 1321    Education Details  HEP; prepared but not printed    Person(s) Educated  Patient    Methods  Explanation    Comprehension  Verbalized understanding          PT Long Term Goals - 01/22/18 1128      PT LONG TERM GOAL #1   Title  independent with HEP    Status  New    Target Date  03/05/18      PT LONG TERM GOAL #2   Title  Ambulate with least assistive device with minimal pain and safe gait pattern     Status  New    Target Date  03/05/18      PT LONG TERM GOAL #3   Title  report ability to stand for 50% of ADLs with pain < 5/10 for improved function    Status  New    Target Date  03/05/18      PT LONG TERM GOAL #4   Title  negotiate stairs with supervision and LRAD for improved mobility    Status  New    Target Date  03/05/18      PT LONG TERM GOAL #5   Title  n/a            Plan - 01/29/18 1322    Clinical Impression Statement  Pt arrived today with reports of "flare up" following last session, but pain today decreased to 4/10 from  last visit of 6/10.  Initiated isometric exercises today and pt tolerated well.  Will continue to benefit from PT to maximize function.  No goals met to date.    Rehab Potential  Good    PT Frequency  2x / week    PT Duration  6 weeks    PT Treatment/Interventions  ADLs/Self Care Home Management;Cryotherapy;Electrical Stimulation;Ultrasound;Traction;Moist Heat;Gait training;DME Instruction;Stair training;Functional mobility training;Therapeutic activities;Therapeutic exercise;Balance training;Neuromuscular re-education;Patient/family education;Orthotic Fit/Training;Manual techniques;Passive range of motion;Dry needling;Taping    PT Next Visit Plan  establish HEP for gentle stretching/strengthening, manual/modalities/taping; review HEP and provide to pt    Consulted and Agree with Plan of Care  Patient       Patient will benefit from skilled therapeutic intervention in order to improve the following deficits and impairments:  Abnormal gait, Increased fascial restricitons, Increased muscle spasms, Postural dysfunction, Decreased strength, Decreased range of motion, Difficulty walking, Decreased balance, Decreased activity tolerance, Decreased endurance, Decreased mobility, Impaired flexibility  Visit Diagnosis: Other symptoms and signs involving the musculoskeletal system  Pain in right leg  Other abnormalities of gait and mobility  Chronic right-sided low back pain with right-sided sciatica     Problem List Patient Active Problem List   Diagnosis Date Noted  . Palpitations   . Lyme disease   . Hair loss   . Chest pain 04/15/2016  . Abnormal stress test 04/15/2016  . Orthostatic hypotension 03/31/2016  . DYSPEPSIA 01/06/2009      Laureen Abrahams, PT, DPT 01/29/18 1:24 PM     [facility] [place], [place], [address] Phone: [phone]   Fax:  [phone]  Name: Jenell Dobransky MRN:  [id number] Date of Birth: 09/12/1950     PHYSICAL THERAPY DISCHARGE SUMMARY  Visits  from Start of Care: 3  Current functional level related to goals / functional outcomes: See above   Remaining deficits: See above   Education / Equipment: HEP  Plan: Patient agrees to discharge.  Patient goals were not met. Patient is being discharged due to not returning since the last visit.  ?????     Laureen Abrahams, PT, DPT 05/08/18 2:53 PM  [facility] [place],  [address]  [phone] (office) [phone] (fax)

## 2018-01-31 ENCOUNTER — Encounter: Payer: Medicare Other | Admitting: Physical Therapy

## 2018-02-06 ENCOUNTER — Encounter: Payer: Medicare Other | Admitting: Physical Therapy

## 2018-02-06 ENCOUNTER — Other Ambulatory Visit: Payer: Self-pay | Admitting: Orthopaedic Surgery

## 2018-02-06 DIAGNOSIS — M545 Low back pain, unspecified: Secondary | ICD-10-CM

## 2018-02-07 IMAGING — MR MR HEAD W/O CM
9 of 11 series · 34 of 48 positions shown · non-contrast
Comparison: Head CT earlier same day

CLINICAL DATA: Code stroke.  Right-sided numbness.  Slurred speech.

EXAM:
MRI HEAD WITHOUT CONTRAST
TECHNIQUE: Multiplanar, multiecho pulse sequences of the brain and surrounding
structures were obtained without intravenous contrast.

[Series 2: FLAIR · sagittal · 5.0mm · 0.47mm/px · 1 of 23 slices shown (1 of 2)]
[im 1/23]
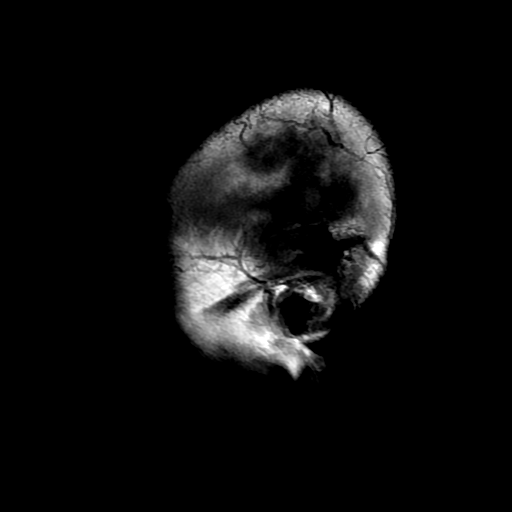

[Series 4: DWI · axial · 3.0mm · 0.94mm/px · z∈[-57,+87]mm · 8 of 100 slices shown (1 of 2)]
[im 1/100]
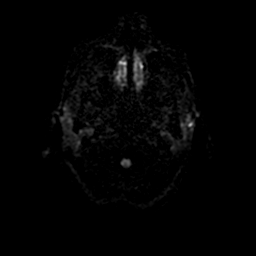
[im 15/100]
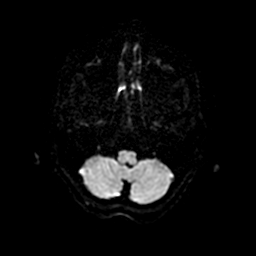
[im 29/100]
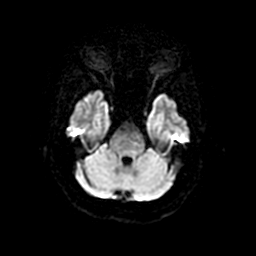
[im 43/100]
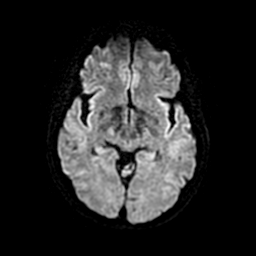
[im 57/100]
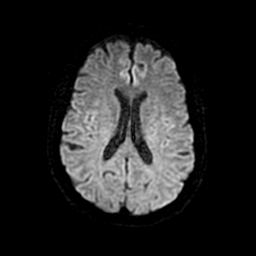
[im 71/100]
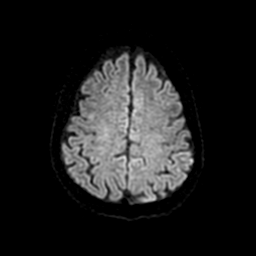
[im 85/100]
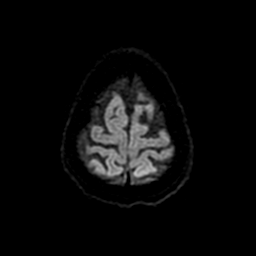
[im 100/100]
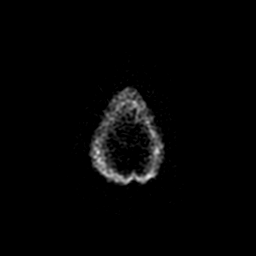

[Series 5: T2 · axial · 5.0mm · 0.47mm/px · z∈[-55,+86]mm · 2 of 25 slices shown (1 of 2)]
[im 1/25]
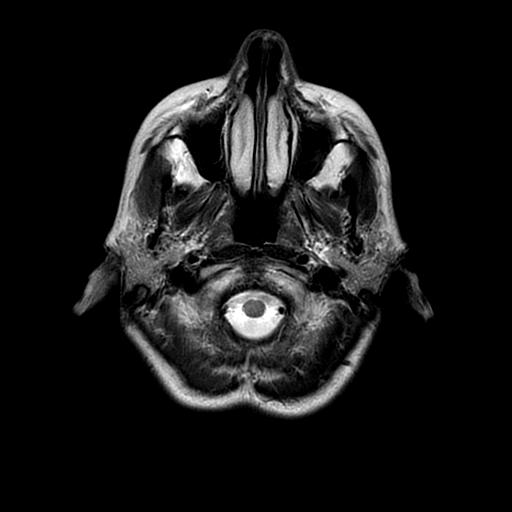
[im 25/25]
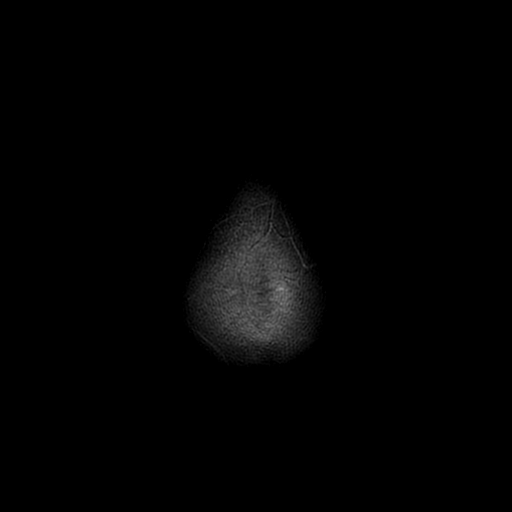

[Series 6: FLAIR · axial · 5.0mm · 0.47mm/px · z∈[-55,+86]mm · 2 of 25 slices shown (2 of 2)]
[im 1/25]
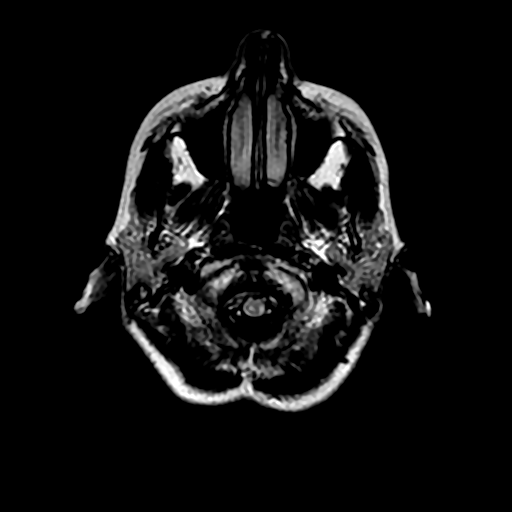
[im 25/25]
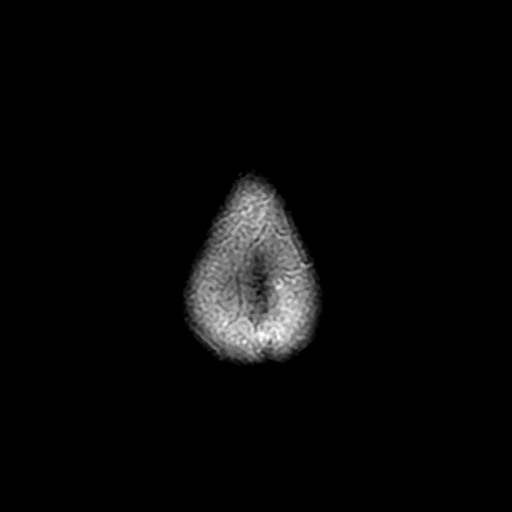

[Series 7: DWI · coronal · 4.0mm · 0.94mm/px · 6 of 74 slices shown (2 of 2)]
[im 1/74]
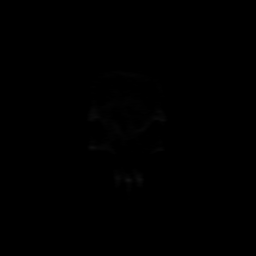
[im 15/74]
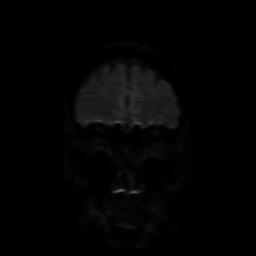
[im 30/74]
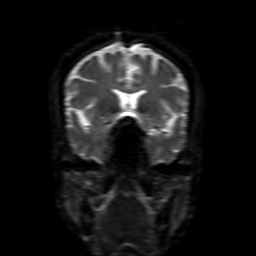
[im 44/74]
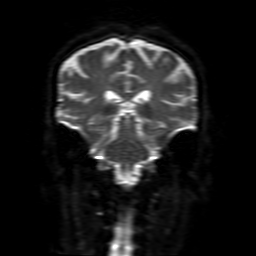
[im 59/74]
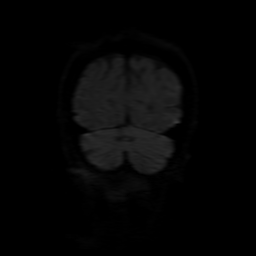
[im 74/74]
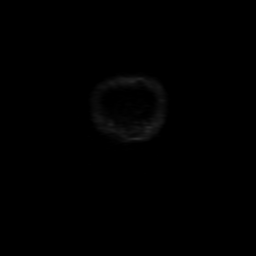

[Series 8: (person_name) · axial · 3.0mm · 0.47mm/px · z∈[-57,+46]mm · 6 of 100 slices shown]
[im 1/100]
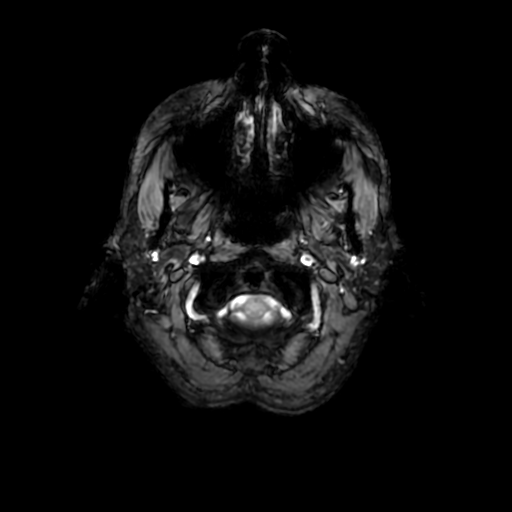
[im 15/100]
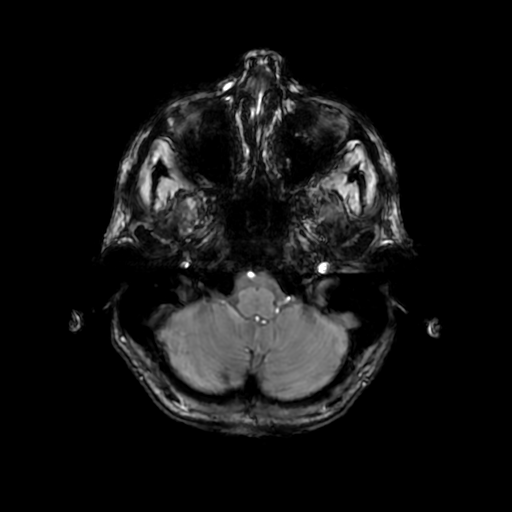
[im 29/100]
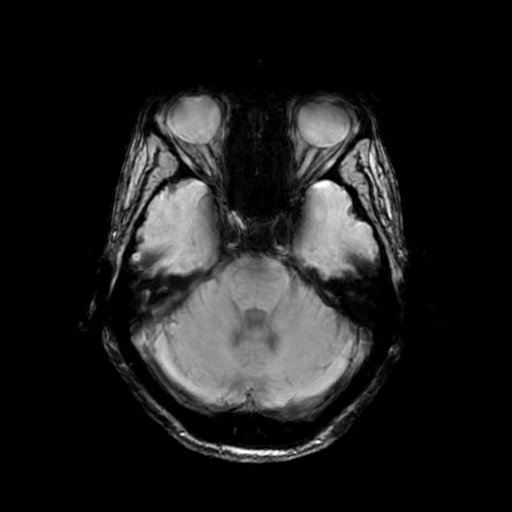
[im 43/100]
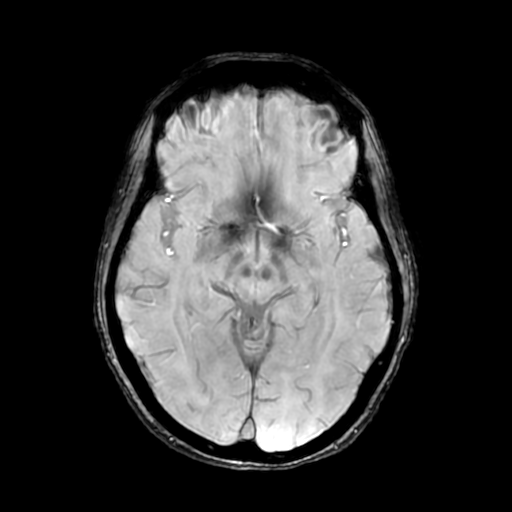
[im 57/100]
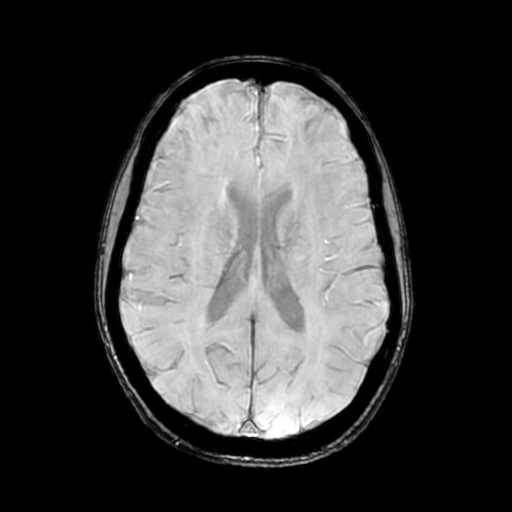
[im 71/100]
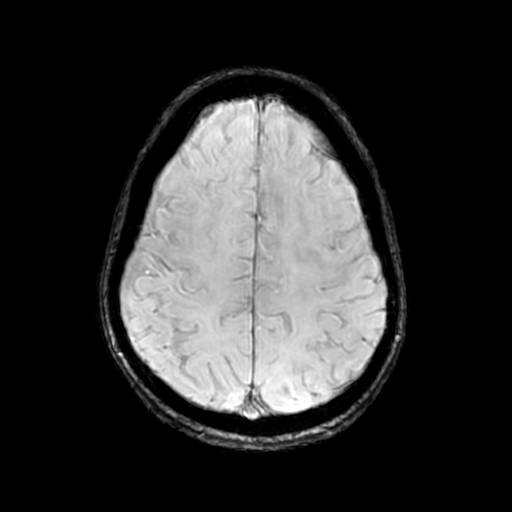

[Series 10: T2 · coronal · 5.0mm · 0.39mm/px · 2 of 31 slices shown (2 of 2)]
[im 1/31]
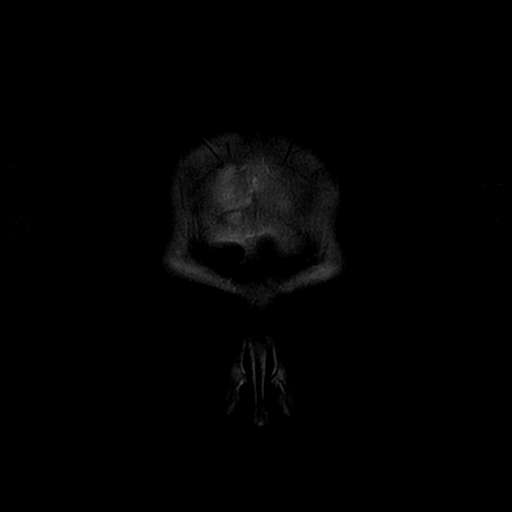
[im 31/31]
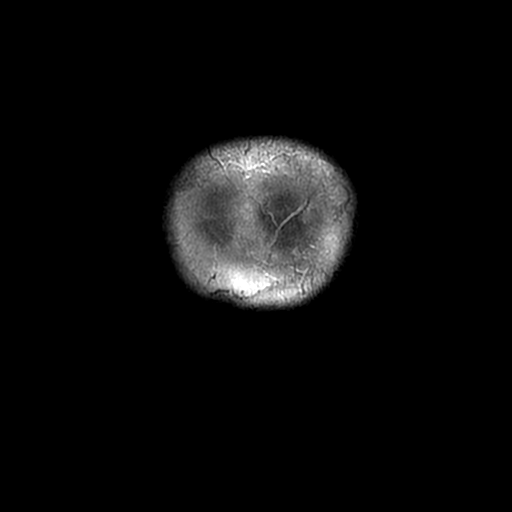

[Series 450: ADC · axial · 3.0mm · 0.94mm/px · z∈[-57,+87]mm · 4 of 50 slices shown (1 of 2)]
[im 1/50]
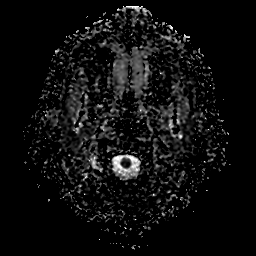
[im 17/50]
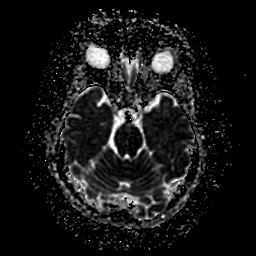
[im 33/50]
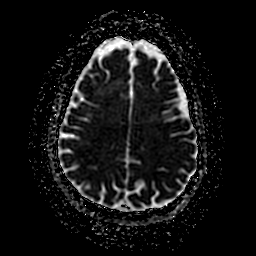
[im 50/50]
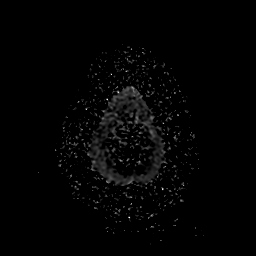

[Series 750: ADC · coronal · 4.0mm · 0.94mm/px · 3 of 37 slices shown (2 of 2)]
[im 1/37]
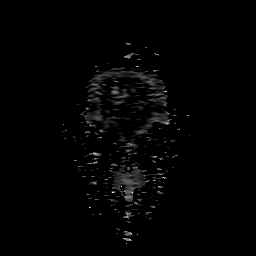
[im 19/37]
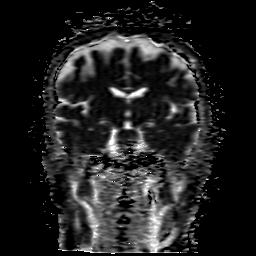
[im 37/37]
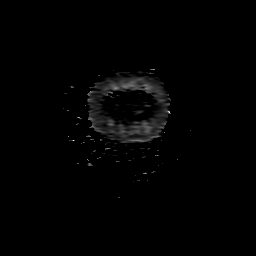

[34 of 48 positions shown; findings below may reference images not displayed]

FINDINGS: Brain: The brain has normal appearance without evidence of
malformation, atrophy, old or acute small or large vessel
infarction, hemorrhage, hydrocephalus or extra-axial collection. No
pituitary abnormality.

Vascular: Major vessels at the base of the brain show flow.

Skull and upper cervical spine: Normal

Sinuses/Orbits: Clear/ normal.

Other: None significant.
IMPRESSION: Normal examination.

## 2018-02-18 ENCOUNTER — Ambulatory Visit
Admission: RE | Admit: 2018-02-18 | Discharge: 2018-02-18 | Disposition: A | Discharge status: Home / Self Care | Payer: Medicare Other | Source: Ambulatory Visit | Attending: Orthopaedic Surgery | Admitting: Orthopaedic Surgery

## 2018-02-18 DIAGNOSIS — M545 Low back pain, unspecified: Secondary | ICD-10-CM

## 2018-02-22 ENCOUNTER — Encounter: Payer: Medicare Other | Admitting: Physical Therapy

## 2018-03-16 ENCOUNTER — Other Ambulatory Visit: Payer: Self-pay | Admitting: Cardiovascular Disease

## 2018-03-16 NOTE — Telephone Encounter (Signed)
Pt's medication was sent to pt's pharmacy as requested. Confirmation received.  °

## 2018-03-21 ENCOUNTER — Ambulatory Visit: Payer: Medicare Other | Admitting: Physical Therapy

## 2018-03-23 ENCOUNTER — Telehealth: Payer: Self-pay | Admitting: Cardiovascular Disease

## 2018-03-23 NOTE — Telephone Encounter (Signed)
Spoke with the patient, she had a stressful family matter on Monday 1/13 and started to feel her PVCs get worse. On 1/14 she also felt a lot of pressure on the back of her neck during her PVCs that she had never felt before. The patient called EMS, they checked her out and her EKG was normal and there assessment was unremarkable. They told her to call them back if anything changes. She states she has been feeling better than she was on Tuesday. She states that her PVCs do not occur constantly and she wanted to see if there was anything she could do before her appointment on 1/28 with Dr. Ladona Ridgelaylor.

## 2018-03-23 NOTE — Telephone Encounter (Signed)
Spoke with Melissa and got the EP appointment moved to 1/24.

## 2018-03-23 NOTE — Telephone Encounter (Signed)
Left message to call back  

## 2018-03-23 NOTE — Telephone Encounter (Signed)
° ° °  Patient calling to report chest tightness, and palpitations.  Scheduler asked smart phrase questions, however patient states "have nurse call her".

## 2018-03-23 NOTE — Telephone Encounter (Signed)
Attempted to call back.

## 2018-03-30 ENCOUNTER — Encounter: Payer: Self-pay | Admitting: Internal Medicine

## 2018-03-30 ENCOUNTER — Ambulatory Visit: Payer: Medicare HMO | Admitting: Internal Medicine

## 2018-03-30 VITALS — BP 120/68 | HR 87 | Ht 69.0 in | Wt 140.0 lb

## 2018-03-30 DIAGNOSIS — R002 Palpitations: Secondary | ICD-10-CM | POA: Diagnosis not present

## 2018-03-30 DIAGNOSIS — I493 Ventricular premature depolarization: Secondary | ICD-10-CM

## 2018-03-30 MED ORDER — METOPROLOL SUCCINATE ER 25 MG PO TB24: 12.5000 mg | ORAL_TABLET | Freq: Every day | ORAL | 3 refills | Status: DC

## 2018-03-30 NOTE — Progress Notes (Signed)
HPI [name] is referred today by Dr. Elease Hashimoto for evaluation of symptomatic PVC's. She is a 68 yo woman with a h/o palpitations who was found to have preserved LV function and almost 30% PVC"s on her 24 hour cardiac monitor. She has not taken her medical therapy. She denies chest pain or sob. No syncope. She does not consume caffeine or ETOH.  Her palpitations are fairly mild. Allergies  Allergen Reactions  . Gluten Meal     Pt preference   . Lactose Intolerance (Gi)     Pt preference      Current Outpatient Medications  Medication Sig Dispense Refill  . aspirin 325 MG tablet Take 325 mg by mouth daily as needed for moderate pain or headache.    . Cholecalciferol (VITAMIN D3) 5000 units CAPS Take 5,000 Units by mouth daily.    . Digestive Enzyme CAPS Take 1 capsule by mouth 2 (two) times daily.    Marland Kitchen estradiol (ESTRACE) 0.1 MG/GM vaginal cream Place 1 Applicatorful vaginally at bedtime.    . gabapentin (NEURONTIN) 300 MG capsule Take 300 mg by mouth at bedtime.  1  . Omega-3 Fatty Acids (FISH OIL) 1000 MG CAPS Take 2,000 mg by mouth daily.    Marland Kitchen PRESCRIPTION MEDICATION as directed. Estradiol 2mg  capsule, take 2mg s daily for 3 weeks on 1 week off     . PRESCRIPTION MEDICATION as directed. Progesterone 300mg s capsule, take 300mg s daily for 3 weeks off 1 week.     . propranolol (INDERAL) 10 MG tablet TAKE 1 TABLET BY MOUTH 4 TIMES DAILY. (IRREGULAR HEARTBEAT) 120 tablet 7  . vitamin E 400 UNIT capsule Take 400 Units by mouth 2 (two) times daily.     No current facility-administered medications for this visit.      Past Medical History:  Diagnosis Date  . Hair loss   . Lyme disease   . Palpitations     ROS:   All systems reviewed and negative except as noted in the HPI.   Past Surgical History:  Procedure Laterality Date  . DILATION AND CURETTAGE OF UTERUS  1971  . LEFT HEART CATH AND CORONARY ANGIOGRAPHY N/A 04/15/2016   Procedure: Left Heart Cath and Coronary  Angiography;  Surgeon: Yvonne Kendall, MD;  Location: [facility];  Service: Cardiovascular;  Laterality: N/A;     Family History  Problem Relation Age of Onset  . Parkinson's disease Mother   . CAD Father      Social History   Socioeconomic History  . Marital status: Married    Spouse name: Not on file  . Number of children: Not on file  . Years of education: Not on file  . Highest education level: Not on file  Occupational History  . Not on file  Social Needs  . Financial resource strain: Not on file  . Food insecurity:    Worry: Not on file    Inability: Not on file  . Transportation needs:    Medical: Not on file    Non-medical: Not on file  Tobacco Use  . Smoking status: Never Smoker  . Smokeless tobacco: Never Used  Substance and Sexual Activity  . Alcohol use: No    Comment: quit at age 41  . Drug use: No  . Sexual activity: Not on file  Lifestyle  . Physical activity:    Days per week: Not on file    Minutes per session: Not on file  . Stress:  Not on file  Relationships  . Social connections:    Talks on phone: Not on file    Gets together: Not on file    Attends religious service: Not on file    Active member of club or organization: Not on file    Attends meetings of clubs or organizations: Not on file    Relationship status: Not on file  . Intimate partner violence:    Fear of current or ex partner: Not on file    Emotionally abused: Not on file    Physically abused: Not on file    Forced sexual activity: Not on file  Other Topics Concern  . Not on file  Social History Narrative  . Not on file     BP 120/68   Pulse 87   Ht 5\' 9"  (1.753 m)   Wt 140 lb (63.5 kg)   SpO2 99%   BMI 20.67 kg/m   Physical Exam:  Well appearing 68 yo woman, NAD HEENT: Unremarkable Neck:  No JVD, no thyromegally Lymphatics:  No adenopathy Back:  No CVA tenderness Lungs:  Clear with no wheezes HEART:  Regular rate rhythm, no murmurs, no rubs, no  clicks Abd:  soft, positive bowel sounds, no organomegally, no rebound, no guarding Ext:  2 plus pulses, no edema, no cyanosis, no clubbing Skin:  No rashes no nodules Neuro:  CN II through XII intact, motor grossly intact  EKG - nsr with PVC's, transition to positive at V3, inferior axis, AVR negative and 1, L positive  Assess/Plan: 1. Symptomatic PVC"s - we discussed the treatment options in detail. Her PVC's are coming from the right ventricle, and her QRS is fairly narrow and in the same axis as her sinus beats. I suspect that they are not far from the AV node. I have reviewed the treatment options in detail with the patient and we will start toprol xl 25 mg 1/2 tab daily. I will see her back in a couple of months and plan to uptitrate the dose as her bp allows. 2. Hypotension - I strongly encouraged the patient to increase her sodium intake.   Leonia ReevesGregg Taylor,M.D.

## 2018-03-30 NOTE — Patient Instructions (Addendum)
Medication Instructions:  Your physician has recommended you make the following change in your medication:   1.  Start taking Toprol XL 25 mg--Take 1/2 tablet by mouth daily.   Labwork: None ordered.  Testing/Procedures: None ordered.  Follow-Up: Your physician wants you to follow-up in: one month with Dr. Iva Boop visit    Any Other Special Instructions Will Be Listed Below (If Applicable).  If you need a refill on your cardiac medications before your next appointment, please call your pharmacy.    Metoprolol extended-release tablets What is this medicine? METOPROLOL (me TOE proe lole) is a beta-blocker. Beta-blockers reduce the workload on the heart and help it to beat more regularly. This medicine is used to treat high blood pressure and to prevent chest pain. It is also used to after a heart attack and to prevent an additional heart attack from occurring. This medicine may be used for other purposes; ask your health care provider or pharmacist if you have questions. COMMON BRAND NAME(S): toprol, Toprol XL What should I tell my health care provider before I take this medicine? They need to know if you have any of these conditions: -diabetes -heart or vessel disease like slow heart rate, worsening heart failure, heart block, sick sinus syndrome or Raynaud's disease -kidney disease -liver disease -lung or breathing disease, like asthma or emphysema -pheochromocytoma -thyroid disease -an unusual or allergic reaction to metoprolol, other beta-blockers, medicines, foods, dyes, or preservatives -pregnant or trying to get pregnant -breast-feeding How should I use this medicine? Take this medicine by mouth with a glass of water. Follow the directions on the prescription label. Do not crush or chew. Take this medicine with or immediately after meals. Take your doses at regular intervals. Do not take more medicine than directed. Do not stop taking this medicine suddenly. This  could lead to serious heart-related effects. Talk to your pediatrician regarding the use of this medicine in children. While this drug may be prescribed for children as young as 6 years for selected conditions, precautions do apply. Overdosage: If you think you have taken too much of this medicine contact a poison control center or emergency room at once. NOTE: This medicine is only for you. Do not share this medicine with others. What if I miss a dose? If you miss a dose, take it as soon as you can. If it is almost time for your next dose, take only that dose. Do not take double or extra doses. What may interact with this medicine? This medicine may interact with the following medications: -certain medicines for blood pressure, heart disease, irregular heart beat -certain medicines for depression, like monoamine oxidase (MAO) inhibitors, fluoxetine, or paroxetine -clonidine -dobutamine -epinephrine -isoproterenol -reserpine This list may not describe all possible interactions. Give your health care provider a list of all the medicines, herbs, non-prescription drugs, or dietary supplements you use. Also tell them if you smoke, drink alcohol, or use illegal drugs. Some items may interact with your medicine. What should I watch for while using this medicine? Visit your doctor or health care professional for regular check ups. Contact your doctor right away if your symptoms worsen. Check your blood pressure and pulse rate regularly. Ask your health care professional what your blood pressure and pulse rate should be, and when you should contact them. You may get drowsy or dizzy. Do not drive, use machinery, or do anything that needs mental alertness until you know how this medicine affects you. Do not sit or stand up quickly,  especially if you are an older patient. This reduces the risk of dizzy or fainting spells. Contact your doctor if these symptoms continue. Alcohol may interfere with the effect of  this medicine. Avoid alcoholic drinks. What side effects may I notice from receiving this medicine? Side effects that you should report to your doctor or health care professional as soon as possible: -allergic reactions like skin rash, itching or hives -cold or numb hands or feet -depression -difficulty breathing -faint -fever with sore throat -irregular heartbeat, chest pain -rapid weight gain -swollen legs or ankles Side effects that usually do not require medical attention (report to your doctor or health care professional if they continue or are bothersome): -anxiety or nervousness -change in sex drive or performance -dry skin -headache -nightmares or trouble sleeping -short term memory loss -stomach upset or diarrhea -unusually tired This list may not describe all possible side effects. Call your doctor for medical advice about side effects. You may report side effects to FDA at 1-800-FDA-1088. Where should I keep my medicine? Keep out of the reach of children. Store at room temperature between 15 and 30 degrees C (59 and 86 degrees F). Throw away any unused medicine after the expiration date. NOTE: This sheet is a summary. It may not cover all possible information. If you have questions about this medicine, talk to your doctor, pharmacist, or health care provider.  2019 Elsevier/Gold Standard (2012-10-26 14:41:37)

## 2018-04-03 ENCOUNTER — Institutional Professional Consult (permissible substitution): Payer: No Typology Code available for payment source | Admitting: Internal Medicine

## 2018-04-24 ENCOUNTER — Telehealth: Payer: Self-pay | Admitting: Internal Medicine

## 2018-04-24 NOTE — Telephone Encounter (Signed)
See office note 03/30/18 with Dr. Ladona Ridgel who started Toprol XL 12.5 mg daily.

## 2018-04-24 NOTE — Telephone Encounter (Signed)
Patient was started on Toprol 25 mg 1/2 tablet daily at office visit with Dr. Ladona Ridgel 03/30/18 for symptomatic PVC's. Dr. Ladona Ridgel wanted to see patient in one month to up titrate Toprol as her BP allows. Patient stated she started taking Toprol 12.5 mg BID on her own because she was having palpitations. Patient stated this is working for her and her BP is fine. Patient is suppose to follow up with Dr. Ladona Ridgel this Friday, but patient wants to know if she really needs to come in if she is doing fine with Toprol 12.5 mg BID. Informed patient that a message would be sent to Dr. Ladona Ridgel for advisement, and that she would need a new prescription for her to take a higher dose.

## 2018-04-24 NOTE — Telephone Encounter (Signed)
Pt c/o medication issue:  1. Name of Medication: metoprolol succinate (TOPROL XL) 25 MG 24 hr tablet  2. How are you currently taking this medication (dosage and times per day)? Take 0.5 tablets (12.5 mg total) by mouth daily.  3. Are you having a reaction (difficulty breathing--STAT)? no  4. What is your medication issue? Patient has increased her medication, she was taking 1/2 a tablets, now she is taking a full tablet.  She would like to speak to Dr. Lubertha Basque nurse about this.

## 2018-04-26 MED ORDER — METOPROLOL SUCCINATE ER 25 MG PO TB24: 12.5000 mg | ORAL_TABLET | Freq: Two times a day (BID) | ORAL | 3 refills | Status: DC

## 2018-04-26 NOTE — Telephone Encounter (Signed)
° ° °  Patient agreed to keep appointment for 2/21. Do not cancel

## 2018-04-26 NOTE — Telephone Encounter (Signed)
Returned call to Pt.  Discussed Pt follow up with Dr. Ladona Ridgel.  Pt has many questions about taking extra medication for breakthrough PVC's.  Advised Pt she should keep her follow up with Dr. Ladona Ridgel to address her addl questions.  Pt states she is on crutches at this time and would be difficult.  Advised Pt we could reschedule her for a month?  Pt agrees that would be best.  Will have Pt follow up pushed out a month.  Resent Pt med in for metoprolol.

## 2018-04-27 ENCOUNTER — Ambulatory Visit: Payer: Medicare HMO | Admitting: Internal Medicine

## 2018-04-30 ENCOUNTER — Telehealth: Payer: Self-pay | Admitting: Internal Medicine

## 2018-04-30 NOTE — Telephone Encounter (Signed)
Pt to see Dr. Ladona Ridgel tomorrow

## 2018-04-30 NOTE — Telephone Encounter (Signed)
  Patient is having some issues that she would like to discuss and would like to see if she can up her beta blockers a bit.

## 2018-05-01 ENCOUNTER — Ambulatory Visit: Payer: Medicare HMO | Admitting: Internal Medicine

## 2018-05-01 ENCOUNTER — Encounter: Payer: Self-pay | Admitting: Internal Medicine

## 2018-05-01 VITALS — BP 108/66 | HR 63 | Ht 69.0 in | Wt 138.8 lb

## 2018-05-01 DIAGNOSIS — I493 Ventricular premature depolarization: Secondary | ICD-10-CM

## 2018-05-01 DIAGNOSIS — R002 Palpitations: Secondary | ICD-10-CM | POA: Diagnosis not present

## 2018-05-01 MED ORDER — METOPROLOL SUCCINATE ER 25 MG PO TB24: ORAL_TABLET | ORAL | 3 refills | Status: DC

## 2018-05-01 NOTE — Patient Instructions (Addendum)
Medication Instructions:  Your physician has recommended you make the following change in your medication:   1.  Increase your Toprol XL 25 mg--Take one tablet in the morning and 1/2 tablet in the evening   Labwork: None ordered.  Testing/Procedures: None ordered.  Follow-Up: Your physician wants you to follow-up in: 3 months with Dr. Ladona Ridgel.      Any Other Special Instructions Will Be Listed Below (If Applicable).  If you need a refill on your cardiac medications before your next appointment, please call your pharmacy.

## 2018-05-01 NOTE — Progress Notes (Signed)
HPI Kaitlyn Faulkner returns today for ongoing followup of her PVC's. 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 In the interim she has had some improvement but notes that she has been stressed out over her adult son's mental problems. Allergies  Allergen Reactions  . Gluten Meal     Pt preference   . Lactose Intolerance (Gi)     Pt preference      Current Outpatient Medications  Medication Sig Dispense Refill  . aspirin 325 MG tablet Take 325 mg by mouth daily as needed for moderate pain or headache.    . Cholecalciferol (VITAMIN D3) 5000 units CAPS Take 5,000 Units by mouth daily.    . Digestive Enzyme CAPS Take 1 capsule by mouth 2 (two) times daily.    Marland Kitchen estradiol (ESTRACE) 0.1 MG/GM vaginal cream Place 1 Applicatorful vaginally at bedtime.    . gabapentin (NEURONTIN) 300 MG capsule Take 300 mg by mouth at bedtime.  1  . metoprolol succinate (TOPROL XL) 25 MG 24 hr tablet Take one tablet in the AM and 1/2 tablet in the PM 135 tablet 3  . Omega-3 Fatty Acids (FISH OIL) 1000 MG CAPS Take 2,000 mg by mouth daily.    Marland Kitchen PRESCRIPTION MEDICATION as directed. Estradiol 2mg  capsule, take 2mg s daily for 3 weeks on 1 week off     . PRESCRIPTION MEDICATION as directed. Progesterone 300mg s capsule, take 300mg s daily for 3 weeks off 1 week.     . propranolol (INDERAL) 10 MG tablet TAKE 1 TABLET BY MOUTH 4 TIMES DAILY. (IRREGULAR HEARTBEAT) 120 tablet 7  . vitamin E 400 UNIT capsule Take 400 Units by mouth 2 (two) times daily.     No current facility-administered medications for this visit.      Past Medical History:  Diagnosis Date  . Hair loss   . Lyme disease   . Palpitations     ROS:   All systems reviewed and negative except as noted in the HPI.   Past Surgical History:  Procedure Laterality Date  . DILATION AND CURETTAGE OF UTERUS  1971  . LEFT HEART CATH AND  CORONARY ANGIOGRAPHY N/A 04/15/2016   Procedure: Left Heart Cath and Coronary Angiography;  Surgeon: Yvonne Kendall, MD;  Location: [facility] INVASIVE CV LAB;  Service: Cardiovascular;  Laterality: N/A;     Family History  Problem Relation Age of Onset  . Parkinson's disease Mother   . CAD Father      Social History   Socioeconomic History  . Marital status: Married    Spouse name: Not on file  . Number of children: Not on file  . Years of education: Not on file  . Highest education level: Not on file  Occupational History  . Not on file  Social Needs  . Financial resource strain: Not on file  . Food insecurity:    Worry: Not on file    Inability: Not on file  . Transportation needs:    Medical: Not on file    Non-medical: Not on file  Tobacco Use  . Smoking status: Never Smoker  . Smokeless tobacco: Never Used  Substance and Sexual Activity  . Alcohol use: No    Comment: quit at age 54  . Drug use: No  . Sexual activity: Not on file  Lifestyle  . Physical activity:  Days per week: Not on file    Minutes per session: Not on file  . Stress: Not on file  Relationships  . Social connections:    Talks on phone: Not on file    Gets together: Not on file    Attends religious service: Not on file    Active member of club or organization: Not on file    Attends meetings of clubs or organizations: Not on file    Relationship status: Not on file  . Intimate partner violence:    Fear of current or ex partner: Not on file    Emotionally abused: Not on file    Physically abused: Not on file    Forced sexual activity: Not on file  Other Topics Concern  . Not on file  Social History Narrative  . Not on file     BP 108/66   Pulse 63   Ht 5\' 9"  (1.753 m)   Wt 138 lb 12.8 oz (63 kg)   SpO2 98%   BMI 20.50 kg/m   Physical Exam:  Well appearing NAD HEENT: Unremarkable Neck:  No JVD, no thyromegally Lymphatics:  No adenopathy Back:  No CVA tenderness Lungs:  Clear  with no wheezes HEART:  Regular rate rhythm, no murmurs, no rubs, no clicks Abd:  soft, positive bowel sounds, no organomegally, no rebound, no guarding Ext:  2 plus pulses, no edema, no cyanosis, no clubbing Skin:  No rashes no nodules Neuro:  CN II through XII intact, motor grossly intact  EKG - nsr   Assess/Plan: 1. PVC's - I have discussed the treatment options. She is better on 12.5 of toprol bid. I have recommended she uptitrate to 25/12.5. We discussed the possibility of flecainide as well. I will see her back in a couple of months. 2. Hypotension - when I saw her last, I recommended she increase her salt intake which she has done. Her bp has increased. I spent over 25 minutes including 50% face to face time with the patient.  Kaitlyn Faulkner.D.

## 2018-05-03 NOTE — Addendum Note (Signed)
Addended by: Solon Augusta on: 05/03/2018 07:47 AM   Modules accepted: Orders

## 2018-05-08 ENCOUNTER — Other Ambulatory Visit: Payer: Self-pay | Admitting: Cardiovascular Disease

## 2018-06-08 ENCOUNTER — Telehealth: Payer: Self-pay | Admitting: Internal Medicine

## 2018-06-08 ENCOUNTER — Other Ambulatory Visit: Payer: Self-pay

## 2018-06-08 MED ORDER — METOPROLOL SUCCINATE ER 25 MG PO TB24: ORAL_TABLET | ORAL | 2 refills | Status: DC

## 2018-06-08 NOTE — Telephone Encounter (Signed)
°*  STAT* If patient is at the pharmacy, call can be transferred to refill team.   1. Which medications need to be refilled? (please list name of each medication and dose if known) metoprolol succinate (TOPROL XL) 25 MG 24 hr tablet  2. Which pharmacy/location (including street and city if local pharmacy) is medication to be sent to? [facility] - [place], [place] - [address]  3. Do they need a 30 day or 90 day supply? 90 days

## 2018-06-15 ENCOUNTER — Ambulatory Visit: Payer: Medicare HMO | Admitting: Internal Medicine

## 2018-07-24 ENCOUNTER — Telehealth: Payer: Self-pay | Admitting: Physician Assistant

## 2018-07-24 NOTE — Telephone Encounter (Signed)
Spoke with patient who confirmed all demographics. Patient has a smart phone and uses My chart. Will have vitals ready for visit. °

## 2018-07-25 NOTE — Progress Notes (Signed)
Virtual Visit via Video Note   This visit type was conducted due to national recommendations for restrictions regarding the COVID-19 Pandemic (e.g. social distancing) in an effort to limit this patient's exposure and mitigate transmission in our community.  Due to her co-morbid illnesses, this patient is at least at moderate risk for complications without adequate follow up.  This format is felt to be most appropriate for this patient at this time.  All issues noted in this document were discussed and addressed.  A limited physical exam was performed with this format.  Please refer to the patient's chart for her consent to telehealth for [facility].   Date:  07/26/2018   ID:  Kaitlyn Faulkner, DOB 04-03-1950, MRN [id number]  Patient Location: Home Provider Location: Home  PCP:  Dois Davenport, MD  Cardiologist:  Kristeen Miss, MD  Electrophysiologist:  Lewayne Bunting, MD   Evaluation Performed:  Follow-Up Visit  Chief Complaint:  6 months follow up  History of Present Illness:    Kaitlyn Faulkner is a 68 y.o. female with hx of lyme disease, CAD, palpitation 2nd to PVCs seen for follow up.   Myoview showed 04/2016 a subtile anterior and apical defect. Cath revealed 04/2016 a moderate - severe ostial stenosis in a small diagonal vessel.   Medical therapy was advised.   The has been followed by Dr. Ladona Ridgel for PVCs. On BID dose of Toprol. Discussed the possibility of flecainide as well. Encouraged to increase salt and protein intake.   She continues to have intermittent palpitation.  Sometimes occurs multiple times in a day and other times she goes without any symptoms for weeks.  No specific triggers.  She might have shortness of breath with palpitation occasionally.  Her symptoms is stable for months.  She denies chest pain, dizziness, syncope, orthopnea, PND, melena or blood in her stool or urine.  The patient does not have symptoms concerning for COVID-19 infection (fever, chills,  cough, or new shortness of breath).    Past Medical History:  Diagnosis Date  . Hair loss   . Lyme disease   . Palpitations    Past Surgical History:  Procedure Laterality Date  . DILATION AND CURETTAGE OF UTERUS  1971  . LEFT HEART CATH AND CORONARY ANGIOGRAPHY N/A 04/15/2016   Procedure: Left Heart Cath and Coronary Angiography;  Surgeon: Yvonne Kendall, MD;  Location: [facility] INVASIVE CV LAB;  Service: Cardiovascular;  Laterality: N/A;     Current Meds  Medication Sig  . Cholecalciferol (VITAMIN D3) 5000 units CAPS Take 5,000 Units by mouth daily.  . Digestive Enzyme CAPS Take 1 capsule by mouth 2 (two) times daily.  Marland Kitchen estradiol (ESTRACE) 0.1 MG/GM vaginal cream Place 1 Applicatorful vaginally at bedtime.  . gabapentin (NEURONTIN) 300 MG capsule Take 300 mg by mouth at bedtime.  . metoprolol succinate (TOPROL-XL) 25 MG 24 hr tablet Take 25 mg by mouth 2 (two) times a day.  . Omega-3 Fatty Acids (FISH OIL) 1000 MG CAPS Take 2,000 mg by mouth daily.  Marland Kitchen PRESCRIPTION MEDICATION as directed. Progesterone 300mg s capsule, take 300mg s daily for 3 weeks off 1 week.   . propranolol (INDERAL) 10 MG tablet TAKE 1 TABLET BY MOUTH 4 TIMES DAILY. (IRREGULAR HEARTBEAT)  . UNABLE TO FIND Calm magnesium natural vitality powder once daily  . vitamin E 400 UNIT capsule Take 400 Units by mouth 2 (two) times daily.  . Vitamin Mixture (ESTER-C PO) Take 2,000 mg by mouth daily.  . [DISCONTINUED] aspirin 325  MG tablet Take 325 mg by mouth daily as needed for moderate pain or headache.     Allergies:   Gluten meal and Lactose intolerance (gi)   Social History   Tobacco Use  . Smoking status: Never Smoker  . Smokeless tobacco: Never Used  Substance Use Topics  . Alcohol use: No    Comment: quit at age 63  . Drug use: No     Family Hx: The patient's family history includes CAD in her father; Parkinson's disease in her mother.  ROS:   Please see the history of present illness.    All other systems  reviewed and are negative.   Prior CV studies:   The following studies were reviewed today:  48 hours monitor  Sinus rhythm.  Frequent PVCs - 29% of her total heart beats were PVCs.  Echo 11/2017 Study Conclusions  - Left ventricle: The cavity size was normal. Wall thickness was   normal. Systolic function was normal. The estimated ejection   fraction was in the range of 55% to 60%. Wall motion was normal;   there were no regional wall motion abnormalities. Left   ventricular diastolic function parameters were normal. - Aortic valve: There was no stenosis. There was mild   regurgitation. - Mitral valve: Mildly thickened leaflets . There was mild   regurgitation. - Left atrium: The atrium was mildly dilated. - Right ventricle: The cavity size was normal. Systolic function   was normal. - Tricuspid valve: Peak RV-RA gradient (S): 36 mm Hg. - Pulmonary arteries: PA peak pressure: 39 mm Hg (S). - Inferior vena cava: The vessel was normal in size. The   respirophasic diameter changes were in the normal range (>= 50%),   consistent with normal central venous pressure.  Impressions:  - Normal LV size with EF 55-60%. Normal diastolic function. Normal   RV size and systolic function. Mild mitral regurgitation. Mild   aortic insufficiency. Mild pulmonary hypertension.  Cath 04/2016 Conclusions: 1. Mild to moderate, non-obstructive coronary artery disease including 40-50% ostial diagonal stenosis and 20% mid LAD disease. 2. 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 3. Upper normal left ventricular filling pressure.  Recommendations: 1. Aggressive lipid therapy to prevent progression of disease involving LAD and diagonal. 2. Medical therapy of fatigue and atypical chest pain, as diagonal branch is not ideal candidate for PCI (and may not explain her symptoms). 3. Follow-up with Dr. Elease Hashimoto as an outpatient.  Labs/Other Tests and Data Reviewed:    EKG:  No ECG reviewed.   Recent Labs: 10/11/2017: BUN 14; Creatinine, Ser 0.77; Hemoglobin 13.4; Platelets 245; Potassium 4.3; Sodium 135 10/30/2017: Magnesium 2.3   Recent Lipid Panel Lab Results  Component Value Date/Time   CHOL 193 10/30/2017 02:23 PM   TRIG 106 10/30/2017 02:23 PM   HDL 80 10/30/2017 02:23 PM   CHOLHDL 2.4 10/30/2017 02:23 PM   LDLCALC 92 10/30/2017 02:23 PM    Wt Readings from Last 3 Encounters:  07/26/18 135 lb (61.2 kg)  05/01/18 138 lb 12.8 oz (63 kg)  03/30/18 140 lb (63.5 kg)     Objective:    Vital Signs:  BP 115/75   Pulse (!) 52   Ht  (1.753 m)   Wt 135 lb (61.2 kg)   BMI 19.94 kg/m    VITAL SIGNS:  reviewed GEN:  no acute distress EYES:  sclerae anicteric, EOMI - Extraocular Movements Intact RESPIRATORY:  normal respiratory effort, symmetric expansion CARDIOVASCULAR:  no peripheral edema SKIN:  no rash, lesions or ulcers. MUSCULOSKELETAL:  no obvious deformities. NEURO:  alert and oriented x 3, no obvious focal deficit  ASSESSMENT & PLAN:    1. CAD -No anginal symptoms.  She is taking aspirin 325 mg intermittently.  Will change to 81 mg daily.  Continue beta-blocker. 10/30/2017: Cholesterol, Total 193; HDL 80; LDL Calculated 92; Triglycerides 106.  Not on any statin therapy.  She want to continue diet control but will consider in the future.  2. PVCs - Followed by Dr.Taylor -Symptoms stable on beta-blocker.  COVID-19 Education: The signs and symptoms of COVID-19 were discussed with the patient and how to seek care for testing (follow up with PCP or arrange E-visit). The importance of social distancing was discussed today.  Time:   Today, I have spent 10  minutes with the patient with telehealth technology discussing the above problems.     Medication Adjustments/Labs and Tests Ordered: Current medicines are reviewed at length with the patient today.  Concerns regarding medicines are outlined above.   Tests Ordered: No orders of the defined types  were placed in this encounter.   Medication Changes: Meds ordered this encounter  Medications  . aspirin EC 81 MG tablet    Sig: Take 1 tablet (81 mg total) by mouth daily.    Dispense:  90 tablet    Refill:  3    Disposition:  Follow up in 6 month(s)  Signed, Manson PasseyBhavinkumar Shakera Ebrahimi, PA  07/26/2018 10:20 AM    [facility]

## 2018-07-26 ENCOUNTER — Other Ambulatory Visit: Payer: Self-pay

## 2018-07-26 ENCOUNTER — Encounter: Payer: Self-pay | Admitting: Physician Assistant

## 2018-07-26 ENCOUNTER — Telehealth (INDEPENDENT_AMBULATORY_CARE_PROVIDER_SITE_OTHER): Payer: Medicare HMO | Admitting: Physician Assistant

## 2018-07-26 VITALS — BP 115/75 | HR 52 | Ht 69.0 in | Wt 135.0 lb

## 2018-07-26 DIAGNOSIS — R002 Palpitations: Secondary | ICD-10-CM | POA: Diagnosis not present

## 2018-07-26 DIAGNOSIS — I493 Ventricular premature depolarization: Secondary | ICD-10-CM

## 2018-07-26 DIAGNOSIS — I2510 Atherosclerotic heart disease of native coronary artery without angina pectoris: Secondary | ICD-10-CM

## 2018-07-26 MED ORDER — ASPIRIN EC 81 MG PO TBEC: 81.0000 mg | DELAYED_RELEASE_TABLET | Freq: Every day | ORAL | 3 refills | Status: AC

## 2018-07-26 NOTE — Patient Instructions (Signed)
Medication Instructions:  Your physician has recommended you make the following change in your medication:  1.  CHANGE the Aspirin to 81 mg daily  If you need a refill on your cardiac medications before your next appointment, please call your pharmacy.   Lab work: NONE ORDERED  If you have labs (blood work) drawn today and your tests are completely normal, you will receive your results only by: Marland Kitchen MyChart Message (if you have MyChart) OR . A paper copy in the mail If you have any lab test that is abnormal or we need to change your treatment, we will call you to review the results.  Testing/Procedures: NONE ORDERED   Follow-Up: At [facility], you and your health needs are our priority.  As part of our continuing mission to provide you with exceptional heart care, we have created designated Provider Care Teams.  These Care Teams include your primary Cardiologist (physician) and Advanced Practice Providers (APPs -  Physician Assistants and Nurse Practitioners) who all work together to provide you with the care you need, when you need it. You will need a follow up appointment in:  6 months.  Please call our office 2 months in advance to schedule this appointment.  You may see Kristeen Miss, MD or one of the following Advanced Practice Providers on your designated Care Team: Tereso Newcomer, PA-C Vin Kenvir, [place] . Berton Bon, NP  Any Other Special Instructions Will Be Listed Below (If Applicable).

## 2018-08-22 ENCOUNTER — Ambulatory Visit (INDEPENDENT_AMBULATORY_CARE_PROVIDER_SITE_OTHER): Payer: Medicare HMO | Admitting: Licensed Clinical Social Worker

## 2018-08-22 DIAGNOSIS — F3341 Major depressive disorder, recurrent, in partial remission: Secondary | ICD-10-CM | POA: Diagnosis not present

## 2018-08-30 ENCOUNTER — Ambulatory Visit (INDEPENDENT_AMBULATORY_CARE_PROVIDER_SITE_OTHER): Payer: Medicare HMO | Admitting: Licensed Clinical Social Worker

## 2018-08-30 DIAGNOSIS — F3341 Major depressive disorder, recurrent, in partial remission: Secondary | ICD-10-CM

## 2018-09-06 ENCOUNTER — Ambulatory Visit: Payer: Medicare HMO | Admitting: Licensed Clinical Social Worker

## 2019-02-28 ENCOUNTER — Other Ambulatory Visit: Payer: Self-pay | Admitting: Internal Medicine

## 2019-03-26 ENCOUNTER — Other Ambulatory Visit: Payer: Self-pay | Admitting: Internal Medicine

## 2019-03-26 MED ORDER — METOPROLOL SUCCINATE ER 25 MG PO TB24: ORAL_TABLET | ORAL | 1 refills | Status: DC

## 2019-03-26 NOTE — Telephone Encounter (Signed)
New Message   *STAT* If patient is at the pharmacy, call can be transferred to refill team.   1. Which medications need to be refilled? (please list name of each medication and dose if known) metoprolol succinate (TOPROL-XL) 25 MG 24 hr tablet  2. Which pharmacy/location (including street and city if local pharmacy) is medication to be sent to? [facility] - [place], [place] - [address]  3. Do they need a 30 day or 90 day supply? 90 day  Patient is out of this medication and needs prescription today.

## 2019-03-27 ENCOUNTER — Other Ambulatory Visit: Payer: Self-pay

## 2019-03-27 ENCOUNTER — Encounter: Payer: Self-pay | Admitting: Cardiovascular Disease

## 2019-03-27 ENCOUNTER — Telehealth (INDEPENDENT_AMBULATORY_CARE_PROVIDER_SITE_OTHER): Payer: Medicare HMO | Admitting: Cardiovascular Disease

## 2019-03-27 VITALS — Ht 69.0 in | Wt 135.0 lb

## 2019-03-27 DIAGNOSIS — R079 Chest pain, unspecified: Secondary | ICD-10-CM

## 2019-03-27 DIAGNOSIS — R002 Palpitations: Secondary | ICD-10-CM | POA: Diagnosis not present

## 2019-03-27 DIAGNOSIS — Z7189 Other specified counseling: Secondary | ICD-10-CM

## 2019-03-27 MED ORDER — METOPROLOL SUCCINATE ER 25 MG PO TB24: ORAL_TABLET | ORAL | 1 refills | Status: DC

## 2019-03-27 NOTE — Patient Instructions (Signed)
Medication Instructions:  Your physician recommends that you continue on your current medications as directed. Please refer to the Current Medication list given to you today.  *If you need a refill on your cardiac medications before your next appointment, please call your pharmacy*  Lab Work: None If you have labs (blood work) drawn today and your tests are completely normal, you will receive your results only by: . MyChart Message (if you have MyChart) OR . A paper copy in the mail If you have any lab test that is abnormal or we need to change your treatment, we will call you to review the results.  Testing/Procedures: None  Follow-Up: At [facility], you and your health needs are our priority.  As part of our continuing mission to provide you with exceptional heart care, we have created designated Provider Care Teams.  These Care Teams include your primary Cardiologist (physician) and Advanced Practice Providers (APPs -  Physician Assistants and Nurse Practitioners) who all work together to provide you with the care you need, when you need it.  Your next appointment:   12 month(s)  The format for your next appointment:   In Person  Provider:   You may see Philip Nahser, MD or one of the following Advanced Practice Providers on your designated Care Team:    Scott Weaver, PA-C  Vin Bhagat, PA-C  Janine Hammond, NP   Other Instructions   

## 2019-03-27 NOTE — Progress Notes (Signed)
Virtual Visit via Telephone Note   This visit type was conducted due to national recommendations for restrictions regarding the COVID-19 Pandemic (e.g. social distancing) in an effort to limit this patient's exposure and mitigate transmission in our community.  Due to her co-morbid illnesses, this patient is at least at moderate risk for complications without adequate follow up.  This format is felt to be most appropriate for this patient at this time.  The patient did not have access to video technology/had technical difficulties with video requiring transitioning to audio format only (telephone).  All issues noted in this document were discussed and addressed.  No physical exam could be performed with this format.  Please refer to the patient's chart for her  consent to telehealth for [facility].   Date:  03/27/2019   ID:  Kaitlyn Faulkner, DOB 10-02-1950, MRN [id number]  Patient Location: Home Provider Location: Office  PCP:  Dois Davenport, MD  Cardiologist:  Kristeen Miss, MD  Electrophysiologist:  Lewayne Bunting, MD   Evaluation Performed:  Follow-Up Visit  Chief Complaint:  Chest pain , PVCs   History of Present Illness:    Kaitlyn Faulkner is a 69 y.o. female with hx of CP, palpitations, and lyme disease.   I last saw her in the office in Aug, 2019. She is seen today via telemedicine .   Is doing well .    Is getting less stress,  Her palpitations have significantly improved.  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    Past Medical History:  Diagnosis Date  . Hair loss   . Lyme disease   . Palpitations    Past Surgical History:  Procedure Laterality Date  . DILATION AND CURETTAGE OF UTERUS  1971  . LEFT HEART CATH AND CORONARY ANGIOGRAPHY N/A  04/15/2016   Procedure: Left Heart Cath and Coronary Angiography;  Surgeon: Yvonne Kendall, MD;  Location: [facility] INVASIVE CV LAB;  Service: Cardiovascular;  Laterality: N/A;     No outpatient medications have been marked as taking for the 03/27/19 encounter (Appointment) with Mireya Meditz, Deloris Ping, MD.     Allergies:   Gluten meal and Lactose intolerance (gi)   Social History   Tobacco Use  . Smoking status: Never Smoker  . Smokeless tobacco: Never Used  Substance Use Topics  . Alcohol use: No    Comment: quit at age 88  . Drug use: No     Family Hx: The patient's family history includes CAD in her father; Parkinson's disease in her mother.  ROS:   Please see the history of present illness.     All other systems reviewed and are negative.   Prior CV studies:   The following studies were reviewed today:    Labs/Other Tests and Data Reviewed:    EKG:  No ECG reviewed.  Recent Labs: No results found for requested labs within last 8760 hours.   Recent Lipid Panel Lab Results  Component Value Date/Time   CHOL 193 10/30/2017 02:23 PM   TRIG 106 10/30/2017 02:23 PM   HDL 80 10/30/2017 02:23 PM   CHOLHDL 2.4 10/30/2017 02:23 PM   LDLCALC 92 10/30/2017 02:23 PM    Wt Readings from Last 3 Encounters:  07/26/18 135 lb (61.2  kg)  05/01/18 138 lb 12.8 oz (63 kg)  03/30/18 140 lb (63.5 kg)     Objective:    Vital Signs:  There were no vitals taken for this visit.      ASSESSMENT & PLAN:    1.  Palpitations:    Much better on Toprol.  We will see him back in  We will continue with current She was originally scheduled to see Dr. Lovena Le and return but at this point he is doing so much better.  We will have her cancel that appointment and I will continue to see her and we will send her back to Dr. Lovena Le if she has worsening PVCs or is contemplating any procedures.  She does not need to consider flecainide since she is doing so much better on the metoprolol.  COVID-19  Education: The signs and symptoms of COVID-19 were discussed with the patient and how to seek care for testing (follow up with PCP or arrange E-visit).  The importance of social distancing was discussed today.  Time:   Today, I have spent  16 minutes with the patient with telehealth technology discussing the above problems.     Medication Adjustments/Labs and Tests Ordered: Current medicines are reviewed at length with the patient today.  Concerns regarding medicines are outlined above.   Tests Ordered: No orders of the defined types were placed in this encounter.   Medication Changes: No orders of the defined types were placed in this encounter.   Follow Up:  In Person in 1 year(s)  Signed, Kaitlyn Moores, MD  03/27/2019 8:21 AM    [facility]

## 2019-04-21 ENCOUNTER — Ambulatory Visit: Payer: Self-pay

## 2019-05-22 ENCOUNTER — Telehealth: Payer: Self-pay | Admitting: Cardiovascular Disease

## 2019-05-22 MED ORDER — METOPROLOL SUCCINATE ER 25 MG PO TB24: ORAL_TABLET | ORAL | 2 refills | Status: DC

## 2019-05-22 NOTE — Telephone Encounter (Signed)
*  STAT* If patient is at the pharmacy, call can be transferred to refill team.   1. Which medications need to be refilled? (please list name of each medication and dose if known)  Metoprolol  2. Which pharmacy/location (including street and city if local pharmacy) is medication to be sent to? [facility] [phone]  3. Do they need a 30 day or 90 day supply? 90 days and refills

## 2019-05-22 NOTE — Telephone Encounter (Signed)
Pt's medication was sent to pt's pharmacy as requested. Confirmation received.  °

## 2019-06-18 ENCOUNTER — Ambulatory Visit: Payer: Medicare HMO | Admitting: Internal Medicine

## 2019-06-25 ENCOUNTER — Telehealth: Payer: Self-pay | Admitting: Cardiovascular Disease

## 2019-06-25 NOTE — Telephone Encounter (Signed)
Pt c/o of Chest Pain: STAT if CP now or developed within 24 hours  1. Are you having CP right now? yes  2. Are you experiencing any other symptoms (ex. SOB, nausea, vomiting, sweating)? Started with sharp pain between shoulder blades then moved to the chest area. Feels like nerve pain. Left arm is kind of hurting. Some lightheadedness that comes and goes.   3. How long have you been experiencing CP? Started last night  4. Is your CP continuous or coming and going? both  5. Have you taken Nitroglycerin? no ?

## 2019-06-25 NOTE — Telephone Encounter (Signed)
Received call transferred directly from operator and spoke with patient. She reports issues with her nervous system. She reports has had these issues before and is not willing to go to the hospital. Patient states she had sharp pain between her shoulders in the middle of the night. She could not get comfortable. She took a couple of baby ASA and was finally able to dose off. Between 6-7 AM the pain transferred to her chest area.  She is continuing to have pains and an on going burning feeling. I advised her due to her symptoms she should go to ED for evaluation. Patient refuses to do this.  She states she has had this before. Was not having when she had last telemedicine visit.  I offered in person visit tomorrow but patient prefers telemedicine visit.  I explained to patient it was difficult to assess chest pain over the phone or virtually and that is why I recommended she go to ED.  Patient agrees to in person visit tomorrow but wants to see Dr Elease Hashimoto.  I scheduled patient to see Dr Elease Hashimoto tomorrow at 10:20.  Patient states she will go to ED if her symptoms worsen

## 2019-06-26 ENCOUNTER — Ambulatory Visit (INDEPENDENT_AMBULATORY_CARE_PROVIDER_SITE_OTHER): Payer: Medicare HMO | Admitting: Cardiovascular Disease

## 2019-06-26 ENCOUNTER — Encounter: Payer: Self-pay | Admitting: Cardiovascular Disease

## 2019-06-26 DIAGNOSIS — Z5329 Procedure and treatment not carried out because of patient's decision for other reasons: Secondary | ICD-10-CM

## 2019-06-26 NOTE — Progress Notes (Signed)
No show

## 2019-06-27 NOTE — Telephone Encounter (Signed)
Left message for patient to call back to report how she is feeling since she cancelled her appointment for yesterday.

## 2019-07-23 DIAGNOSIS — Z91199 Patient's noncompliance with other medical treatment and regimen due to unspecified reason: Secondary | ICD-10-CM | POA: Insufficient documentation

## 2019-08-24 ENCOUNTER — Emergency Department (HOSPITAL_COMMUNITY)
Admission: EM | Admit: 2019-08-24 | Discharge: 2019-08-24 | Disposition: A | Discharge status: Home / Self Care | Payer: Medicare HMO | Attending: Emergency Medicine | Admitting: Emergency Medicine

## 2019-08-24 ENCOUNTER — Encounter (HOSPITAL_COMMUNITY): Payer: Self-pay | Admitting: Emergency Medicine

## 2019-08-24 ENCOUNTER — Other Ambulatory Visit: Payer: Self-pay

## 2019-08-24 DIAGNOSIS — R0789 Other chest pain: Secondary | ICD-10-CM | POA: Insufficient documentation

## 2019-08-24 DIAGNOSIS — Z5321 Procedure and treatment not carried out due to patient leaving prior to being seen by health care provider: Secondary | ICD-10-CM | POA: Insufficient documentation

## 2019-08-24 LAB — BASIC METABOLIC PANEL
Anion gap: 9 (ref 5–15)
BUN: 12 mg/dL (ref 8–23)
CO2: 27 mmol/L (ref 22–32)
Calcium: 9.7 mg/dL (ref 8.9–10.3)
Chloride: 97 mmol/L — ABNORMAL LOW (ref 98–111)
Creatinine, Ser: 0.61 mg/dL (ref 0.44–1.00)
GFR calc Af Amer: >60 mL/min (ref >60)
GFR calc non Af Amer: >60 mL/min (ref >60)
Glucose, Bld: 101 mg/dL — ABNORMAL HIGH (ref 70–99)
Potassium: 4.4 mmol/L (ref 3.5–5.1)
Sodium: 133 mmol/L — ABNORMAL LOW (ref 135–145)

## 2019-08-24 LAB — CBC
HCT: 39.9 % (ref 36.0–46.0)
Hemoglobin: 13.8 g/dL (ref 12.0–15.0)
MCH: 31.5 pg (ref 26.0–34.0)
MCHC: 34.6 g/dL (ref 30.0–36.0)
MCV: 91.1 fL (ref 80.0–100.0)
Platelets: 248 10*3/uL (ref 150–400)
RBC: 4.38 MIL/uL (ref 3.87–5.11)
RDW: 11.3 % — ABNORMAL LOW (ref 11.5–15.5)
WBC: 6.4 10*3/uL (ref 4.0–10.5)
nRBC: 0 % (ref 0.0–0.2)

## 2019-08-24 LAB — TROPONIN I (HIGH SENSITIVITY): Troponin I (High Sensitivity): 5 ng/L (ref <18)

## 2019-08-24 MED ORDER — SODIUM CHLORIDE 0.9% FLUSH: 3.0000 mL | Freq: Once | INTRAVENOUS | Status: DC

## 2019-08-24 NOTE — ED Notes (Signed)
Pt brought to triage

## 2019-08-24 NOTE — ED Triage Notes (Signed)
Pt woke up with L sided chest pain at 6:30am.  Denies nausea, vomiting, and SOB.  States pain has improved.

## 2019-08-24 NOTE — ED Notes (Signed)
Pt declines x-ray.  Pt states she is leaving because she can't go to a room right away.  Explained the wait for treatment room and that pt has 3 people in front of her for exam room. Explained the importance of lab work and waiting for labs to result and pt states she will look at labs on MyChart.  States she has a hurt back and is unable to wait.  Pt was sitting on a cushion in a recliner in triage and states she is leaving.  Encouraged her multiple times to stay and she declines.

## 2019-08-29 ENCOUNTER — Telehealth: Payer: Self-pay | Admitting: Internal Medicine

## 2019-08-29 NOTE — Telephone Encounter (Signed)
Her symptoms do not sound cardiac.   Work up was negative in the ER .  See if any of her symptoms worsen / occur with exercise.  I recommend that she walk several miles ( 2-3)  a day at least 4-5 days a week .  At this point , I dont think she needs a stress test or echo

## 2019-08-29 NOTE — Telephone Encounter (Signed)
Returned call to Pt.  Pt went to ER on 08/24/2019 for left sided chest pain per ER note.  Pt calling office today to report ER visit "to make sure I cover my bases"  Per Pt she has had increasing episodes of discomfort  Per Pt her episodes start with a "jolt to my big toe" which then travels to her heart area.   She describes this feeling as "electrical or nerve" pain.  After the feeling reaches her heart it then radiates to a pressure to the back of her neck/head and down her arm.    Per Pt the episode will "kinda calm down" after awhile (time varies) but she is left with a light chest pressure.    Per Pt episodes seem to happen mainly when she is lying down.  She has these episodes a couple of times a week.    Asked Pt if she has used nitro tabs.  She states she took one once and will never try it again because of severe headache.  Of note-Pt states her pulse remains stable and she is not having an irregular heart beat or palpitations.    EKG and lab work is available for review from ER visit.  Pt states it is very difficult for her to come to the office.  If any testing needed would be glad for that to just be ordered for her, or if she needs to discuss further with MD would like a virtual visit.  Will forward to primary cardiology for review/advisement.

## 2019-08-29 NOTE — Telephone Encounter (Signed)
Advised patient that Dr. Elease Hashimoto recommended that her symptoms do not sound cardiac related and further testing was not indicated at this time. Advised that she should follow up with her PCP which she states that she did yesterday.

## 2019-08-29 NOTE — Telephone Encounter (Signed)
Yes,  please have her follow up with her primary MD

## 2019-08-29 NOTE — Telephone Encounter (Signed)
New Message   Pt is calling and says she was recently in the hospital. She says he wants to discuss with Dr Lubertha Basque nurse    Please call

## 2019-11-22 ENCOUNTER — Telehealth: Payer: Self-pay | Admitting: Cardiovascular Disease

## 2019-11-22 ENCOUNTER — Telehealth: Payer: Self-pay | Admitting: Internal Medicine

## 2019-11-22 NOTE — Telephone Encounter (Signed)
Returned call to Kaitlyn Faulkner.  She is very concerned about irregular heartbeats, feelings in her chest.  Made appt with GT for 11/25/19 at 8:45 am to discuss.  Kaitlyn Faulkner asked if Dr. Ladona Ridgel is able to order tests like a general cardiologist.  Reassured he could order tests if needed.

## 2019-11-22 NOTE — Telephone Encounter (Signed)
Patient states she wants to be seen soon. She has some things she is worried about and is requesting to see Dr. Ladona Ridgel. She states Dr. Elease Hashimoto does not accept her insurance. She was up to see an EP APP but the next available was 11/29/19 and she did not want to wait that long. She states she will just wait to talk to Dr. Lubertha Basque nurse in regards to what is going on. Please advise.

## 2019-11-22 NOTE — Telephone Encounter (Signed)
New message:       Patient calling stating she would like for some one to call her concerning her having some issues with chest discomfort. Please call patient. Patient would like to be seen.

## 2019-11-25 ENCOUNTER — Ambulatory Visit: Payer: Medicare HMO | Admitting: Internal Medicine

## 2019-11-25 ENCOUNTER — Other Ambulatory Visit: Payer: Self-pay

## 2019-11-25 ENCOUNTER — Encounter: Payer: Self-pay | Admitting: Internal Medicine

## 2019-11-25 ENCOUNTER — Telehealth (HOSPITAL_COMMUNITY): Payer: Self-pay | Admitting: Internal Medicine

## 2019-11-25 DIAGNOSIS — I493 Ventricular premature depolarization: Secondary | ICD-10-CM | POA: Diagnosis not present

## 2019-11-25 DIAGNOSIS — R079 Chest pain, unspecified: Secondary | ICD-10-CM

## 2019-11-25 NOTE — Progress Notes (Signed)
HPI Mrs. Dethlefs returns today for followup of her PVC's. She is a pleasant 69 yo woman with a h/o PVC's, who has been treated with a beta blocker. She was seen by me about 18 months ago. We started a beta blocker. She has done fairly well but noted about 5 days ago, she was awakened with shoulder and chest pain, radiating to her left arm. Eventually she symptoms resolved. No fever or chills or night sweats. She has occasional palpitations. No edema. She also had some nausea. She does not think that her heart was racing. Allergies  Allergen Reactions  . Gluten Meal     Pt preference   . Lactose Intolerance (Gi)     Pt preference      Current Outpatient Medications  Medication Sig Dispense Refill  . aspirin EC 81 MG tablet Take 1 tablet (81 mg total) by mouth daily. 90 tablet 3  . Cholecalciferol (VITAMIN D3) 5000 units CAPS Take 5,000 Units by mouth daily.    . Digestive Enzyme CAPS Take 1 capsule by mouth 2 (two) times daily.    Marland Kitchen estradiol (ESTRACE) 0.1 MG/GM vaginal cream Place 1 Applicatorful vaginally at bedtime.    . gabapentin (NEURONTIN) 300 MG capsule Take 300 mg by mouth at bedtime.  1  . metoprolol succinate (TOPROL-XL) 25 MG 24 hr tablet TAKE 1 TABLET BY MOUTH IN THE MORNING AND 1/2 TABLET IN THE EVENING. 135 tablet 2  . Omega-3 Fatty Acids (FISH OIL) 1000 MG CAPS Take 2,000 mg by mouth daily.    Marland Kitchen PRESCRIPTION MEDICATION as directed. Progesterone 300mg s capsule, take 300mg s daily for 3 weeks off 1 week.     UNABLE TO FIND Calm magnesium natural vitality powder once daily    . vitamin E 400 UNIT capsule Take 400 Units by mouth 2 (two) times daily.    . Vitamin Mixture (ESTER-C PO) Take 2,000 mg by mouth daily.     No current facility-administered medications for this visit.     Past Medical History:  Diagnosis Date  . Hair loss   . Lyme disease   . Palpitations     ROS:   All systems reviewed and negative except as noted in the HPI.   Past Surgical  History:  Procedure Laterality Date  . DILATION AND CURETTAGE OF UTERUS  1971  . LEFT HEART CATH AND CORONARY ANGIOGRAPHY N/A 04/15/2016   Procedure: Left Heart Cath and Coronary Angiography;  Surgeon: Marland Kitchen, MD;  Location: [facility] INVASIVE CV LAB;  Service: Cardiovascular;  Laterality: N/A;     Family History  Problem Relation Age of Onset  . Parkinson's disease Mother   . CAD Father      Social History   Socioeconomic History  . Marital status: Married    Spouse name: Not on file  . Number of children: Not on file  . Years of education: Not on file  . Highest education level: Not on file  Occupational History  . Not on file  Tobacco Use  . Smoking status: Never Smoker  . Smokeless tobacco: Never Used  Vaping Use  . Vaping Use: Never used  Substance and Sexual Activity  . Alcohol use: No    Comment: quit at age 5  . Drug use: No  . Sexual activity: Not on file  Other Topics Concern  . Not on file  Social History Narrative  . Not on file   Social Determinants of Health   Financial  Resource Strain:   . Difficulty of Paying Living Expenses: Not on file  Food Insecurity:   . Worried About Programme researcher, broadcasting/film/video in the Last Year: Not on file  . Ran Out of Food in the Last Year: Not on file  Transportation Needs:   . Lack of Transportation (Medical): Not on file  . Lack of Transportation (Non-Medical): Not on file  Physical Activity:   . Days of Exercise per Week: Not on file  . Minutes of Exercise per Session: Not on file  Stress:   . Feeling of Stress : Not on file  Social Connections:   . Frequency of Communication with Friends and Family: Not on file  . Frequency of Social Gatherings with Friends and Family: Not on file  . Attends Religious Services: Not on file  . Active Member of Clubs or Organizations: Not on file  . Attends Banker Meetings: Not on file  . Marital Status: Not on file  Intimate Partner Violence:   . Fear of Current or  Ex-Partner: Not on file  . Emotionally Abused: Not on file  . Physically Abused: Not on file  . Sexually Abused: Not on file     BP 108/68   Pulse 60   Ht 5\' 9"  (1.753 m)   Wt 134 lb (60.8 kg)   SpO2 96%   BMI 19.79 kg/m   Physical Exam:  Well appearing NAD HEENT: Unremarkable Neck:  No JVD, no thyromegally Lymphatics:  No adenopathy Back:  No CVA tenderness Lungs:  Clear with no wheezes HEART:  Regular rate rhythm, no murmurs, no rubs, no clicks Abd:  soft, positive bowel sounds, no organomegally, no rebound, no guarding Ext:  2 plus pulses, no edema, no cyanosis, no clubbing Skin:  No rashes no nodules Neuro:  CN II through XII intact, motor grossly intact  EKG - NSR  Assess/Plan: 1. Chest pain - the etiology is unclear. I have recommended a lexiscan myoview as we know she has CAD. 2. Palpitations -these appear to be better on the beta blocker.we will hold her beta blocker before the stress test. 3. PVC' s - her ecg is reassuring. I considered rechecking the density of her PVC's but she does not think that this has worsened and she is very cardiac aware.  Emonte Dieujuste,MD

## 2019-11-25 NOTE — Telephone Encounter (Signed)
Patient cancelled Myocardial perfusion test due to her back and states she cant handle a 3-4 hour test at this time. She will call at a later date to reschedule per Blenda Peals. Order will be removed from WQ and when patient calls back to schedule we will reinstate the order. Thank you.

## 2019-11-25 NOTE — Patient Instructions (Addendum)
Medication Instructions:  Your physician recommends that you continue on your current medications as directed. Please refer to the Current Medication list given to you today.  Labwork: None ordered.  Testing/Procedures: Your physician has requested that you have a lexiscan myoview.   Please schedule for lexiscan stress test.   Follow-Up: Your physician wants you to follow-up based on results of your test.  Any Other Special Instructions Will Be Listed Below (If Applicable).  If you need a refill on your cardiac medications before your next appointment, please call your pharmacy.

## 2019-12-12 ENCOUNTER — Encounter (HOSPITAL_COMMUNITY): Payer: Medicare HMO

## 2020-01-14 ENCOUNTER — Telehealth: Payer: Self-pay

## 2020-01-14 NOTE — Telephone Encounter (Signed)
Patient called in wanting to go over her x rays she had with dr Roda Shutters  Back in 07/2017 . Says she wants to know what kind of x rays she had . And has questions regarding the x rays

## 2020-01-15 NOTE — Telephone Encounter (Signed)
Spoke to patient answered her questions and reassurance was provided.

## 2020-01-21 ENCOUNTER — Ambulatory Visit (INDEPENDENT_AMBULATORY_CARE_PROVIDER_SITE_OTHER): Payer: Medicare HMO

## 2020-01-21 ENCOUNTER — Ambulatory Visit: Payer: Medicare HMO | Admitting: Orthopaedic Surgery

## 2020-01-21 ENCOUNTER — Encounter: Payer: Self-pay | Admitting: Orthopaedic Surgery

## 2020-01-21 VITALS — Ht 69.0 in | Wt 135.0 lb

## 2020-01-21 DIAGNOSIS — M25551 Pain in right hip: Secondary | ICD-10-CM

## 2020-01-21 DIAGNOSIS — G8929 Other chronic pain: Secondary | ICD-10-CM

## 2020-01-21 DIAGNOSIS — M5441 Lumbago with sciatica, right side: Secondary | ICD-10-CM | POA: Diagnosis not present

## 2020-01-21 NOTE — Progress Notes (Signed)
Office Visit Note   Patient: Kaitlyn Faulkner           Date of Birth: 10/10/1950           MRN: [id number] Visit Date: 01/21/2020              Requested by: Dois Davenport, MD [address] [place],  [place] [address] PCP: Dois Davenport, MD   Assessment & Plan: Visit Diagnoses:  1. Pain in right hip   2. Chronic right-sided low back pain with right-sided sciatica     Plan: I impression is chronic low back pain questionable pain syndrome.  Arthritis panel obtained today to rule out autoimmune disorders.  Based on findings she would most benefit from a referral to a pain clinic.  Questions encouraged and answered. Total face to face encounter time was greater than 25 minutes and over half of this time was spent in counseling and/or coordination of care.  Follow-Up Instructions: Return if symptoms worsen or fail to improve.   Orders:  Orders Placed This Encounter  Procedures  . XR HIP UNILAT W OR W/O PELVIS 2-3 VIEWS RIGHT  . XR Lumbar Spine 2-3 Views   No orders of the defined types were placed in this encounter.     Procedures: No procedures performed   Clinical Data: No additional findings.   Subjective: Chief Complaint  Patient presents with  . Right Hip - Pain  . Lower Back - Pain    [name] is a 69 year old female that I saw a couple years ago.  She comes in for continued right hip and lower back and buttock pain there will occasionally radiate down into the leg that is intense.  She states that she has been ambulating with a pair crutches for 3 years.  She feels right foot numbness when the back flares up.  She takes aspirin and gabapentin.  She has done extensive physical therapy and massage therapy.  Resting and ice helps.  Denies any true or constant groin pain.  Denies any bowel or bladder dysfunction.   Review of Systems  Constitutional: Negative.   HENT: Negative.   Eyes: Negative.   Respiratory: Negative.   Cardiovascular: Negative.    Endocrine: Negative.   Musculoskeletal: Negative.   Neurological: Negative.   Hematological: Negative.   Psychiatric/Behavioral: Negative.   All other systems reviewed and are negative.    Objective: Vital Signs: Ht 5\' 9"  (1.753 m)   Wt 135 lb (61.2 kg)   BMI 19.94 kg/m   Physical Exam Vitals and nursing note reviewed.  Constitutional:      Appearance: She is well-developed.  Pulmonary:     Effort: Pulmonary effort is normal.  Skin:    General: Skin is warm.     Capillary Refill: Capillary refill takes less than 2 seconds.  Neurological:     Mental Status: She is alert and oriented to person, place, and time.  Psychiatric:        Behavior: Behavior normal.   Flat affect  Ortho Exam Lower extremity shows no focal motor or sensory deficits.  Diminished patellar reflexes bilaterally.  She is able to lift her leg against gravity without any difficulty.  Lumbar spine is slightly tender to palpation.  Well-preserved hip range of motion bilaterally.  Specialty Comments:  No specialty comments available.  Imaging: XR HIP UNILAT W OR W/O PELVIS 2-3 VIEWS RIGHT  Result Date: 01/21/2020 No acute or structural abnormalities.  Mild hip OA.  XR Lumbar Spine 2-3 Views  Result Date: 01/21/2020 Mild lumbar spondylosis.  No acute or structural abnormalities.  Lumbar lordosis is well-preserved.    PMFS History: Patient Active Problem List   Diagnosis Date Noted  . PVC's (premature ventricular contractions) 11/25/2019  . No-show for appointment 07/23/2019  . Palpitations   . Lyme disease   . Hair loss   . Chest pain 04/15/2016  . Abnormal stress test 04/15/2016  . Orthostatic hypotension 03/31/2016  . DYSPEPSIA 01/06/2009   Past Medical History:  Diagnosis Date  . Hair loss   . Lyme disease   . Palpitations     Family History  Problem Relation Age of Onset  . Parkinson's disease Mother   . CAD Father     Past Surgical History:  Procedure Laterality Date  .  DILATION AND CURETTAGE OF UTERUS  1971  . LEFT HEART CATH AND CORONARY ANGIOGRAPHY N/A 04/15/2016   Procedure: Left Heart Cath and Coronary Angiography;  Surgeon: Yvonne Kendall, MD;  Location: [facility] INVASIVE CV LAB;  Service: Cardiovascular;  Laterality: N/A;   Social History   Occupational History  . Not on file  Tobacco Use  . Smoking status: Never Smoker  . Smokeless tobacco: Never Used  Vaping Use  . Vaping Use: Never used  Substance and Sexual Activity  . Alcohol use: No    Comment: quit at age 80  . Drug use: No  . Sexual activity: Not on file

## 2020-01-21 NOTE — Addendum Note (Signed)
Addended by: Albertina Parr on: 01/21/2020 12:59 PM   Modules accepted: Orders

## 2020-01-22 LAB — RHEUMATOID FACTOR: Rheumatoid fact SerPl-aCnc: 60 IU/mL — ABNORMAL HIGH (ref <14)

## 2020-01-22 LAB — URIC ACID: Uric Acid, Serum: 3.5 mg/dL (ref 2.5–7.0)

## 2020-01-22 LAB — ANA: Anti Nuclear Antibody (ANA): NEGATIVE

## 2020-01-22 LAB — SEDIMENTATION RATE: Sed Rate: 11 mm/h (ref 0–30)

## 2020-01-22 NOTE — Progress Notes (Signed)
Please refer to rheumatology.  Thanks.

## 2020-01-24 ENCOUNTER — Other Ambulatory Visit: Payer: Self-pay

## 2020-01-24 DIAGNOSIS — R7689 Other specified abnormal immunological findings in serum: Secondary | ICD-10-CM

## 2020-01-24 DIAGNOSIS — R768 Other specified abnormal immunological findings in serum: Secondary | ICD-10-CM

## 2020-01-27 NOTE — Progress Notes (Signed)
Spoke to patient

## 2020-02-10 ENCOUNTER — Telehealth: Payer: Self-pay | Admitting: Internal Medicine

## 2020-02-10 MED ORDER — METOPROLOL SUCCINATE ER 25 MG PO TB24: ORAL_TABLET | ORAL | 1 refills | Status: DC

## 2020-02-10 NOTE — Telephone Encounter (Signed)
Pt's medication was sent to pt's pharmacy as requested. Confirmation received.  °

## 2020-02-10 NOTE — Telephone Encounter (Signed)
*  STAT* If patient is at the pharmacy, call can be transferred to refill team.   1. Which medications need to be refilled? (please list name of each medication and dose if known) metoprolol succinate (TOPROL-XL) 25 MG 24 hr tablet  2. Which pharmacy/location (including street and city if local pharmacy) is medication to be sent to? [facility] - [place], [place] - [address]  3. Do they need a 30 day or 90 day supply? 90 day

## 2020-03-19 ENCOUNTER — Other Ambulatory Visit: Payer: Self-pay | Admitting: Family Medicine

## 2020-03-19 DIAGNOSIS — M7989 Other specified soft tissue disorders: Secondary | ICD-10-CM

## 2020-03-26 ENCOUNTER — Other Ambulatory Visit: Payer: Medicare HMO

## 2020-04-02 ENCOUNTER — Other Ambulatory Visit: Payer: Medicare HMO

## 2020-04-09 ENCOUNTER — Ambulatory Visit
Admission: RE | Admit: 2020-04-09 | Discharge: 2020-04-09 | Disposition: A | Discharge status: Home / Self Care | Payer: Medicare HMO | Source: Ambulatory Visit | Attending: Family Medicine | Admitting: Family Medicine

## 2020-04-09 DIAGNOSIS — M7989 Other specified soft tissue disorders: Secondary | ICD-10-CM

## 2020-07-17 ENCOUNTER — Other Ambulatory Visit: Payer: Self-pay | Admitting: Cardiovascular Disease

## 2020-07-20 ENCOUNTER — Telehealth: Payer: Self-pay | Admitting: Cardiovascular Disease

## 2020-07-20 NOTE — Telephone Encounter (Signed)
The patient reports CP for 5 days.  She states she is OK in the AM and in the early afternoon, her CP "flares up" and she feels hot. The pain and tightness starts in her chest and radiates up her neck and down her left arm.  Sometimes she feels a "pinching" sensation in her chest.  She reports her L ribs are tender to the touch but it does not hurt to take a deep breath. She is having active symptoms right now, but they "aren't that bad."  She repeated over again that she will not go to the ER. It's a "waste of time" and the "people there aren't kind." Reiterated to her that Dr. Ladona Ridgel ordered her a stress test and offered to get it rescheduled. She refused stating her back "just can't take it" even after explaining to her that it is a chemical stress test.  After reviewing options, she will contact PCP for appointment in case her symptoms are musculoskeletal.  Scheduled her for visit with Mardelle Matte next week. She will call to cancel if she deems uneccessary.  ER precautions reviewed.

## 2020-07-20 NOTE — Telephone Encounter (Signed)
  Pt c/o of Chest Pain: STAT if CP now or developed within 24 hours  1. Are you having CP right now? yes  2. Are you experiencing any other symptoms (ex. SOB, nausea, vomiting, sweating)? When it comes on in episodes she feels hot and feels a squeezing and sometimes feels lightheaded, pain in arm and neck  3. How long have you been experiencing CP? 5 days  4. Is your CP continuous or coming and going? Gets worse in afternoon, comes and goes  5. Have you taken Nitroglycerin? No, has tried before and gave her a headache, has been taking aspirin.    Patient states she is having chest pain now and has been for 5 days. She states she does not want to go to the ED and just wants an appointment with Dr. Elease Hashimoto as soon as possible.  ?

## 2020-07-23 NOTE — Telephone Encounter (Signed)
Discussed case with Dr. Ladona Ridgel.  No further action needed.

## 2020-07-29 ENCOUNTER — Ambulatory Visit: Payer: Medicare HMO | Admitting: Student

## 2020-11-05 ENCOUNTER — Telehealth: Payer: Self-pay | Admitting: Internal Medicine

## 2020-11-05 NOTE — Telephone Encounter (Signed)
Pt c/o BP issue: STAT if pt c/o blurred vision, one-sided weakness or slurred speech  1. What are your last 5 BP readings? 120/58 Pulse 49 currently  2. Are you having any other symptoms (ex. Dizziness, headache, blurred vision, passed out)? Dizziness in her head  3. What is your BP issue? Feeling lightheaded for 1 week, started taking BP yesterday 120/42 Pulse 45 at 8:30pm lastnight after dinner No blurred visin, one sided weakness or slurred speech

## 2020-11-05 NOTE — Progress Notes (Signed)
Cardiology Office Note   Date:  11/06/2020   ID:  Kaitlyn Faulkner, DOB [birth year], MRN [id number]  PCP:  Dois Davenport, MD  [name].  Cardiologist:   Kristeen Miss, MD   No chief complaint on file.  Problem List 1. Chest pain  2. Lyme disease  3. Palpitations     Kaitlyn Faulkner is a 70 y.o. female who presents for eval of chest pain  Has had Lyme disease - diagnosed with Lyme disease 2 years ago .   Is having some neuro issues  Has also been having chest pain issues Feels like "something is taking over her body" Ringing in her ears,   Gets sweats,  Nauseated,  Pressure in left side of neck, left arm pain  Can last several hours Had an episode last night  Has several episodes each week  Is not associated with standing  Occasionally is brought on my eating   Has had a stress test 2 years ago, ECGs always look fine. Has been seen in the ER several times and nothing has shown up Has had an echo  Has been to a medical intuitive doctor - who thought that she had some pulmonary issues.    Father had heart disease in his 43   Has Lyme disease .  She did not complete the course of Abx.  Caused stomach issues Is considering getting Ozone treatments at [facility]   Is vegetarian, has been losing weight  Eats 2 pea protein smoothies a day .  Not much other protein . Maybe 1/2 an egg a week.   May 16, 2016:  Kaitlyn Faulkner is seen today . We scheduled a myoview after her last visit myoview shows a subtile anterior and apical defect. She is quite thin and this is unlikely to be breast artifact. She continues to have significant symptoms.  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   Medical therapy was advised.   Has had a rough winter according to her She was having ? stroke symptoms on Feb. 16 and went to the ER.   Had some dissorientation.   Right arm numbness Had CT scan looked good  She thinks it could be Lyme disease.    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  Nov. 7, 2018:  Kaitlyn Faulkner is doing better from a cardiac standpoint. Has a back injury. Is in a wheelchair  BP has been well controlled.   Has chronic Lyme disease and has continued to have issues with palps (which she thinks are related) .   Also has lots of stresses.    Is not walking at all - has had this back injury for months.  Seeing a chiropractor and a spine doctor  Has  Episodes of atypical CP .   Associated with being very warm and palpitations , followed by a sensation of being cold .   Aug. 26, 2019  Kaitlyn Faulkner has been having more palpitations Takes propranolol as needed  metoprolol made her fatigued and caused some mild hypotension Having some CP , atypical  Is not taking the metoprolol   Sept. 2, 2022:  Kaitlyn Faulkner is seen today for follow up of her palpitations and slow HR  Has had some lightheadedness She found her HR and BP to be on the low end .  She replaced her batteries in her BP cuff and the reading came up .   She  is concerned about having atrial fib .    Past Medical History:  Diagnosis Date   Hair loss    Lyme disease    Palpitations     Past Surgical History:  Procedure Laterality Date   DILATION AND CURETTAGE OF UTERUS  1971   LEFT HEART CATH AND CORONARY ANGIOGRAPHY N/A 04/15/2016   Procedure: Left Heart Cath and Coronary Angiography;  Surgeon: Yvonne Kendall, MD;  Location: [facility];  Service: Cardiovascular;  Laterality: N/A;     Current Outpatient Medications  Medication Sig Dispense Refill   aspirin EC 81 MG tablet Take 1 tablet (81 mg total) by mouth daily. 90 tablet 3   Cholecalciferol (VITAMIN D3) 5000 units CAPS Take 5,000 Units by mouth daily.     Digestive Enzyme CAPS Take 1 capsule by mouth 2 (two) times daily.     estradiol (ESTRACE) 0.1 MG/GM vaginal cream Place 1 Applicatorful vaginally at bedtime.     gabapentin  (NEURONTIN) 300 MG capsule Take 300 mg by mouth at bedtime.  1   metoprolol succinate (TOPROL XL) 25 MG 24 hr tablet Take 1 tablet (25 mg total) by mouth daily. 90 tablet 3   Omega-3 Fatty Acids (FISH OIL) 1000 MG CAPS Take 2,000 mg by mouth daily.     PRESCRIPTION MEDICATION as directed. Progesterone 300mg s capsule, take 300mg s daily for 3 weeks off 1 week.      UNABLE TO FIND Calm magnesium natural vitality powder once daily     vitamin E 400 UNIT capsule Take 400 Units by mouth 2 (two) times daily.     Vitamin Mixture (ESTER-C PO) Take 2,000 mg by mouth daily.     No current facility-administered medications for this visit.    Allergies:   Gluten meal and Lactose intolerance (gi)    Social History:  The patient  reports that she has never smoked. She has never used smokeless tobacco. She reports that she does not drink alcohol and does not use drugs.   Family History:  The patient's family history includes CAD in her father; Parkinson's disease in her mother.   ROS:  Please see the history of present illness.   Review of Systems: As her HPI, otherwise negative.   Physical Exam: Blood pressure 108/62, pulse 65, height 5\' 9"  (1.753 m), weight 129 lb 9.6 oz (58.8 kg), SpO2 96 %.  GEN:  Well nourished, well developed in no acute distress HEENT: Normal NECK: No JVD; No carotid bruits LYMPHATICS: No lymphadenopathy CARDIAC: RRR , no murmurs, rubs, gallops RESPIRATORY:  Clear to auscultation without rales, wheezing or rhonchi  ABDOMEN: Soft, non-tender, non-distended MUSCULOSKELETAL:  No edema; No deformity  SKIN: Warm and dry NEUROLOGIC:  Alert and oriented x 3    EKG:    November 06, 2020: Normal sinus rhythm at 65.  No ST or T wave changes.    Recent Labs: No results found for requested labs within last 8760 hours.    Lipid Panel    Component Value Date/Time   CHOL 193 10/30/2017 1423   TRIG 106 10/30/2017 1423   HDL 80 10/30/2017 1423   CHOLHDL 2.4 10/30/2017 1423    LDLCALC 92 10/30/2017 1423      Wt Readings from Last 3 Encounters:  11/06/20 129 lb 9.6 oz (58.8 kg)  01/21/20 135 lb (61.2 kg)  11/25/19 134 lb (60.8 kg)      Other studies Reviewed: Additional studies/ records that were reviewed today include: . Review of the  above records demonstrates:    ASSESSMENT AND PLAN:  1.  CAD :       minimal CAD , no angina   Has some chest wall pain   2. Orthostasis:      2. Chronic lyme disease:      3. Palpitations:    feels like her HR is too slow currently . Will reduce her toprol XL to 25 mg a day    4.  Elevated triglyceride level:   - further plans per her primrary MD    Current medicines are reviewed at length with the patient today.  The patient does not have concerns regarding medicines.  Labs/ tests ordered today include:   Orders Placed This Encounter  Procedures   EKG 12-Lead      Disposition:   FU with me in 1 year .      Kristeen Miss, MD  11/06/2020 1:50 PM    [facility] [address] [place], [place], [place]  [address] Phone: [phone]; Fax: [phone]

## 2020-11-05 NOTE — Telephone Encounter (Signed)
Called patient back about message. Patient complaining of low BP and dizziness. Patient also has low HR 40's. Patient is currently taking metoprolol succinate 25 mg BID. Informed patient that she should hold her metoprolol until she hears back from our office or sees Dr. Elease Hashimoto. Encouraged patient to drink some fluids. Since patient has not had an OV for almost a year, and complained of chest pain in May. Patient refusing to get stress test done that was ordered last year. Made patient an appointment with Dr. Elease Hashimoto (DOD) tomorrow to be evaulated.  Will forward to Dr. Elease Hashimoto for advisement.

## 2020-11-06 ENCOUNTER — Encounter: Payer: Self-pay | Admitting: Cardiovascular Disease

## 2020-11-06 ENCOUNTER — Ambulatory Visit: Payer: Medicare HMO | Admitting: Cardiovascular Disease

## 2020-11-06 ENCOUNTER — Other Ambulatory Visit: Payer: Self-pay

## 2020-11-06 VITALS — BP 108/62 | HR 65 | Ht 69.0 in | Wt 129.6 lb

## 2020-11-06 DIAGNOSIS — I493 Ventricular premature depolarization: Secondary | ICD-10-CM | POA: Diagnosis not present

## 2020-11-06 MED ORDER — METOPROLOL SUCCINATE ER 25 MG PO TB24
25.0000 mg | ORAL_TABLET | Freq: Every day | ORAL | 3 refills | Status: DC
Start: 2020-11-06 — End: 2021-11-26

## 2020-11-06 NOTE — Patient Instructions (Addendum)
Kardia monitor by [employer] Cor The Kardia device is $79 - available on [email or web address]   Medication Instructions:  Your physician has recommended you make the following change in your medication: 1) DECREASE metoprolol succinate to 25 mg once daily   *If you need a refill on your cardiac medications before your next appointment, please call your pharmacy*   Follow-Up: At [facility], you and your health needs are our priority.  As part of our continuing mission to provide you with exceptional heart care, we have created designated Provider Care Teams.  These Care Teams include your primary Cardiologist (physician) and Advanced Practice Providers (APPs -  Physician Assistants and Nurse Practitioners) who all work together to provide you with the care you need, when you need it.   Your next appointment:   1 year(s)  The format for your next appointment:   In Person  Provider:   You may see Kristeen Miss, MD or one of the following Advanced Practice Providers on your designated Care Team:   Ronie Spies, PA-C Jacolyn Reedy, PA-C

## 2021-05-05 LAB — COLOGUARD: COLOGUARD: NEGATIVE

## 2021-09-17 ENCOUNTER — Other Ambulatory Visit: Payer: Self-pay | Admitting: Internal Medicine

## 2021-09-17 DIAGNOSIS — I25118 Atherosclerotic heart disease of native coronary artery with other forms of angina pectoris: Secondary | ICD-10-CM

## 2021-09-17 DIAGNOSIS — R079 Chest pain, unspecified: Secondary | ICD-10-CM

## 2021-09-17 DIAGNOSIS — I208 Other forms of angina pectoris: Secondary | ICD-10-CM

## 2021-09-22 ENCOUNTER — Telehealth (HOSPITAL_COMMUNITY): Payer: Self-pay | Admitting: *Deleted

## 2021-09-22 NOTE — Telephone Encounter (Signed)
Reaching out to patient to offer assistance regarding upcoming cardiac imaging study; pt verbalizes understanding of appt date/time, parking situation and where to check in, pre-test NPO status and verified current allergies; name and call back number provided for further questions should they arise  Larey Brick RN Navigator Cardiac Imaging [facility] [phone] office [phone] cell  Patient confirms  HR is around 60bpm and will take her metoprolol succiante two hours prior to her cardiac CT scan.

## 2021-09-23 ENCOUNTER — Ambulatory Visit
Admission: RE | Admit: 2021-09-23 | Discharge: 2021-09-23 | Disposition: A | Discharge status: Home / Self Care | Payer: Medicare HMO | Source: Ambulatory Visit | Attending: Internal Medicine | Admitting: Internal Medicine

## 2021-09-23 DIAGNOSIS — I208 Other forms of angina pectoris: Secondary | ICD-10-CM | POA: Insufficient documentation

## 2021-09-23 DIAGNOSIS — R079 Chest pain, unspecified: Secondary | ICD-10-CM | POA: Diagnosis present

## 2021-09-23 DIAGNOSIS — I25118 Atherosclerotic heart disease of native coronary artery with other forms of angina pectoris: Secondary | ICD-10-CM | POA: Insufficient documentation

## 2021-09-23 MED ORDER — IOHEXOL 350 MG/ML SOLN
75.0000 mL | Freq: Once | INTRAVENOUS | Status: AC | PRN
  Administered 2021-09-23: 75 mL via INTRAVENOUS

## 2021-09-23 MED ORDER — NITROGLYCERIN 0.4 MG SL SUBL
0.8000 mg | SUBLINGUAL_TABLET | Freq: Once | SUBLINGUAL | Status: AC
  Administered 2021-09-23: 0.8 mg via SUBLINGUAL

## 2021-09-23 NOTE — Progress Notes (Signed)
Patient tolerated procedure well. Ambulate w/o difficulty. Denies any lightheadedness or being dizzy. Pt denies any pain at this time. Sitting in chair, drinking water provided. P is encouraged to drink additional water throughout the day and reason explained to patient. Patient verbalized understanding and all questions answered. ABC intact. No further needs at this time. Discharge from procedure area w/o issues.  °

## 2021-11-22 ENCOUNTER — Telehealth: Payer: Self-pay | Admitting: Internal Medicine

## 2021-11-22 NOTE — Telephone Encounter (Signed)
Calling to speak to nurse. Please advise

## 2021-11-22 NOTE — Telephone Encounter (Signed)
Patient reports having episodes of chest pain that sometimes wakes her up at night. Pain radiates to neck and shoulder. Sometimes she has nausea with it. These episodes resolve on their on and she does not take nitro. She states she has been to ED for this and her EKG was normal.  She wants to see someone about wearing an event monitor.  Scheduled for APP visit on 11/27/2021.  Advised to go to ED is pain reoccurs, she develops SOB and nausea and vomiting.  Patient verbalized understanding.
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
## 2021-11-26 ENCOUNTER — Encounter: Payer: Self-pay | Admitting: Nurse Practitioner

## 2021-11-26 ENCOUNTER — Ambulatory Visit: Payer: Medicare HMO | Attending: Nurse Practitioner | Admitting: Nurse Practitioner

## 2021-11-26 ENCOUNTER — Ambulatory Visit (INDEPENDENT_AMBULATORY_CARE_PROVIDER_SITE_OTHER): Payer: Medicare HMO

## 2021-11-26 VITALS — BP 110/62 | HR 58 | Ht 69.0 in | Wt 131.0 lb

## 2021-11-26 DIAGNOSIS — R002 Palpitations: Secondary | ICD-10-CM

## 2021-11-26 DIAGNOSIS — I493 Ventricular premature depolarization: Secondary | ICD-10-CM | POA: Diagnosis not present

## 2021-11-26 DIAGNOSIS — E785 Hyperlipidemia, unspecified: Secondary | ICD-10-CM

## 2021-11-26 DIAGNOSIS — I2510 Atherosclerotic heart disease of native coronary artery without angina pectoris: Secondary | ICD-10-CM | POA: Diagnosis not present

## 2021-11-26 MED ORDER — METOPROLOL SUCCINATE ER 25 MG PO TB24: ORAL_TABLET | ORAL | 3 refills | Status: AC

## 2021-11-26 NOTE — Patient Instructions (Signed)
Medication Instructions:   Your physician recommends that you continue on your current medications as directed. Please refer to the Current Medication list given to you today.   *If you need a refill on your cardiac medications before your next appointment, please call your pharmacy*   Lab Work:  None ordered.  If you have labs (blood work) drawn today and your tests are completely normal, you will receive your results only by: [place] (if you have MyChart) OR A paper copy in the mail If you have any lab test that is abnormal or we need to change your treatment, we will call you to review the results.   Testing/Procedures:  Bryn Gulling- Long Term Monitor Instructions  Your physician has requested you wear a ZIO patch monitor for 14 days.  This is a single patch monitor. [employer] supplies one patch monitor per enrollment. Additional stickers are not available. Please do not apply patch if you will be having a Nuclear Stress Test,  Echocardiogram, Cardiac CT, MRI, or Chest Xray during the period you would be wearing the  monitor. The patch cannot be worn during these tests. You cannot remove and re-apply the  ZIO XT patch monitor.  Your ZIO patch monitor will be mailed 3 day USPS to your address on file. It may take 3-5 days  to receive your monitor after you have been enrolled.  Once you have received your monitor, please review the enclosed instructions. Your monitor  has already been registered assigning a specific monitor serial # to you.  Billing and Patient Assistance Program Information  We have supplied [employer] with any of your insurance information on file for billing purposes. [employer] offers a sliding scale Patient Assistance Program for patients that do not have  insurance, or whose insurance does not completely cover the cost of the ZIO monitor.  You must apply for the Patient Assistance Program to qualify for this discounted rate.  To apply, please call [employer] at  [phone], select option 4, select option 2, ask to apply for  Patient Assistance Program. Theodore Demark will ask your household income, and how many people  are in your household. They will quote your out-of-pocket cost based on that information.  [employer] will also be able to set up a 62-month, interest-free payment plan if needed.  Applying the monitor   Shave hair from upper left chest.  Hold abrader disc by orange tab. Rub abrader in 40 strokes over the upper left chest as  indicated in your monitor instructions.  Clean area with 4 enclosed alcohol pads. Let dry.  Apply patch as indicated in monitor instructions. Patch will be placed under collarbone on left  side of chest with arrow pointing upward.  Rub patch adhesive wings for 2 minutes. Remove white label marked "1". Remove the white  label marked "2". Rub patch adhesive wings for 2 additional minutes.  While looking in a mirror, press and release button in center of patch. A small green light will  flash 3-4 times. This will be your only indicator that the monitor has been turned on.  Do not shower for the first 24 hours. You may shower after the first 24 hours.  Press the button if you feel a symptom. You will hear a small click. Record Date, Time and  Symptom in the Patient Logbook.  When you are ready to remove the patch, follow instructions on the last 2 pages of Patient  Logbook. Stick patch monitor onto the last page of Patient  Logbook.  Place Patient Logbook in the blue and white box. Use locking tab on box and tape box closed  securely. The blue and white box has prepaid postage on it. Please place it in the mailbox as  soon as possible. Your physician should have your test results approximately 7 days after the  monitor has been mailed back to [facility].  Call [facility] at [phone] if you have questions regarding  your ZIO XT patch monitor. Call them immediately if you see an orange light  blinking on your  monitor.  If your monitor falls off in less than 4 days, contact our Monitor department at [phone].  If your monitor becomes loose or falls off after 4 days call [employer] at [phone] for  suggestions on securing your monitor    Follow-Up: At [facility], you and your health needs are our priority.  As part of our continuing mission to provide you with exceptional heart care, we have created designated Provider Care Teams.  These Care Teams include your primary Cardiologist (physician) and Advanced Practice Providers (APPs -  Physician Assistants and Nurse Practitioners) who all work together to provide you with the care you need, when you need it.  We recommend signing up for the patient portal called "MyChart".  Sign up information is provided on this After Visit Summary.  MyChart is used to connect with patients for Virtual Visits (Telemedicine).  Patients are able to view lab/test results, encounter notes, upcoming appointments, etc.  Non-urgent messages can be sent to your provider as well.   To learn more about what you can do with MyChart, go to [email or web address].    Your next appointment:   6 week(s)  The format for your next appointment:   In Person  Provider:   Christen Bame, NP         Other Instructions  Kardia Monitor  Please Fax labs to Christen Bame, NP @ [phone]  Important Information About Sugar

## 2021-11-26 NOTE — Progress Notes (Unsigned)
ZIO XT 14 day mailed to pt's home address  Dr Acie Fredrickson to read

## 2021-11-29 DIAGNOSIS — R002 Palpitations: Secondary | ICD-10-CM | POA: Diagnosis not present

## 2021-11-29 DIAGNOSIS — I493 Ventricular premature depolarization: Secondary | ICD-10-CM | POA: Diagnosis not present

## 2022-01-10 ENCOUNTER — Ambulatory Visit: Payer: Medicare HMO | Admitting: Nurse Practitioner

## 2022-05-23 ENCOUNTER — Encounter: Payer: Self-pay | Admitting: *Deleted

## 2022-05-23 NOTE — Progress Notes (Signed)
Abstract source: [facility] Medicine referral notes

## 2022-05-24 ENCOUNTER — Ambulatory Visit: Payer: Medicare HMO | Admitting: Neurology

## 2022-05-24 ENCOUNTER — Telehealth: Payer: Self-pay | Admitting: Neurology

## 2022-06-20 ENCOUNTER — Other Ambulatory Visit: Payer: Self-pay | Admitting: Family Medicine

## 2022-06-20 DIAGNOSIS — R911 Solitary pulmonary nodule: Secondary | ICD-10-CM

## 2022-06-21 ENCOUNTER — Encounter: Payer: Self-pay | Admitting: Neurology

## 2022-06-21 ENCOUNTER — Ambulatory Visit: Payer: Medicare HMO | Admitting: Neurology

## 2022-07-25 ENCOUNTER — Other Ambulatory Visit: Payer: Medicare HMO

## 2022-08-22 ENCOUNTER — Other Ambulatory Visit: Payer: Self-pay | Admitting: Student

## 2022-08-22 DIAGNOSIS — M79604 Pain in right leg: Secondary | ICD-10-CM

## 2022-08-22 DIAGNOSIS — R200 Anesthesia of skin: Secondary | ICD-10-CM

## 2022-08-22 DIAGNOSIS — M5431 Sciatica, right side: Secondary | ICD-10-CM

## 2022-08-22 DIAGNOSIS — R251 Tremor, unspecified: Secondary | ICD-10-CM

## 2022-08-25 ENCOUNTER — Ambulatory Visit
Admission: RE | Admit: 2022-08-25 | Discharge: 2022-08-25 | Disposition: A | Discharge status: Home / Self Care | Payer: Medicare HMO | Source: Ambulatory Visit | Attending: Family Medicine | Admitting: Family Medicine

## 2022-08-25 DIAGNOSIS — R911 Solitary pulmonary nodule: Secondary | ICD-10-CM

## 2022-08-29 ENCOUNTER — Encounter: Payer: Self-pay | Admitting: Student

## 2022-09-02 ENCOUNTER — Ambulatory Visit
Admission: RE | Admit: 2022-09-02 | Discharge: 2022-09-02 | Disposition: A | Discharge status: Home / Self Care | Payer: Medicare HMO | Source: Ambulatory Visit | Attending: Student | Admitting: Student

## 2022-09-02 DIAGNOSIS — R251 Tremor, unspecified: Secondary | ICD-10-CM

## 2022-09-02 DIAGNOSIS — M5431 Sciatica, right side: Secondary | ICD-10-CM

## 2022-09-02 DIAGNOSIS — R200 Anesthesia of skin: Secondary | ICD-10-CM

## 2022-09-02 DIAGNOSIS — M79604 Pain in right leg: Secondary | ICD-10-CM

## 2022-09-26 ENCOUNTER — Encounter: Payer: Self-pay | Admitting: Student

## 2022-09-27 ENCOUNTER — Other Ambulatory Visit: Payer: Medicare HMO

## 2023-04-04 ENCOUNTER — Other Ambulatory Visit: Payer: Self-pay | Admitting: Cardiology

## 2023-04-04 DIAGNOSIS — R079 Chest pain, unspecified: Secondary | ICD-10-CM

## 2023-08-03 DIAGNOSIS — H35371 Puckering of macula, right eye: Secondary | ICD-10-CM | POA: Diagnosis not present

## 2023-08-03 DIAGNOSIS — H25013 Cortical age-related cataract, bilateral: Secondary | ICD-10-CM | POA: Diagnosis not present

## 2023-08-03 DIAGNOSIS — H2513 Age-related nuclear cataract, bilateral: Secondary | ICD-10-CM | POA: Diagnosis not present

## 2023-08-03 DIAGNOSIS — H18413 Arcus senilis, bilateral: Secondary | ICD-10-CM | POA: Diagnosis not present

## 2023-08-28 DIAGNOSIS — H2513 Age-related nuclear cataract, bilateral: Secondary | ICD-10-CM | POA: Diagnosis not present

## 2023-08-28 DIAGNOSIS — H25043 Posterior subcapsular polar age-related cataract, bilateral: Secondary | ICD-10-CM | POA: Diagnosis not present

## 2023-08-28 DIAGNOSIS — H25013 Cortical age-related cataract, bilateral: Secondary | ICD-10-CM | POA: Diagnosis not present

## 2023-10-03 DIAGNOSIS — Z789 Other specified health status: Secondary | ICD-10-CM | POA: Diagnosis not present

## 2023-10-03 DIAGNOSIS — H259 Unspecified age-related cataract: Secondary | ICD-10-CM | POA: Diagnosis not present

## 2023-10-03 DIAGNOSIS — Z13820 Encounter for screening for osteoporosis: Secondary | ICD-10-CM | POA: Diagnosis not present

## 2023-10-03 DIAGNOSIS — Z79899 Other long term (current) drug therapy: Secondary | ICD-10-CM | POA: Diagnosis not present

## 2023-10-03 DIAGNOSIS — Z0000 Encounter for general adult medical examination without abnormal findings: Secondary | ICD-10-CM | POA: Diagnosis not present

## 2023-10-03 DIAGNOSIS — Z13220 Encounter for screening for lipoid disorders: Secondary | ICD-10-CM | POA: Diagnosis not present

## 2023-10-03 DIAGNOSIS — I2510 Atherosclerotic heart disease of native coronary artery without angina pectoris: Secondary | ICD-10-CM | POA: Diagnosis not present

## 2023-10-03 DIAGNOSIS — R251 Tremor, unspecified: Secondary | ICD-10-CM | POA: Diagnosis not present

## 2023-10-11 ENCOUNTER — Other Ambulatory Visit (HOSPITAL_BASED_OUTPATIENT_CLINIC_OR_DEPARTMENT_OTHER): Payer: Self-pay | Admitting: Family Medicine

## 2023-10-11 DIAGNOSIS — Z13820 Encounter for screening for osteoporosis: Secondary | ICD-10-CM

## 2023-10-17 DIAGNOSIS — H25012 Cortical age-related cataract, left eye: Secondary | ICD-10-CM | POA: Diagnosis not present

## 2023-10-17 DIAGNOSIS — H25042 Posterior subcapsular polar age-related cataract, left eye: Secondary | ICD-10-CM | POA: Diagnosis not present

## 2023-10-17 DIAGNOSIS — H2511 Age-related nuclear cataract, right eye: Secondary | ICD-10-CM | POA: Diagnosis not present

## 2023-10-17 DIAGNOSIS — H268 Other specified cataract: Secondary | ICD-10-CM | POA: Diagnosis not present

## 2023-10-17 DIAGNOSIS — H2512 Age-related nuclear cataract, left eye: Secondary | ICD-10-CM | POA: Diagnosis not present

## 2023-10-31 DIAGNOSIS — H2512 Age-related nuclear cataract, left eye: Secondary | ICD-10-CM | POA: Diagnosis not present

## 2023-10-31 DIAGNOSIS — H25012 Cortical age-related cataract, left eye: Secondary | ICD-10-CM | POA: Diagnosis not present

## 2023-10-31 DIAGNOSIS — H268 Other specified cataract: Secondary | ICD-10-CM | POA: Diagnosis not present

## 2023-10-31 DIAGNOSIS — H25042 Posterior subcapsular polar age-related cataract, left eye: Secondary | ICD-10-CM | POA: Diagnosis not present

## 2024-01-10 DIAGNOSIS — M9901 Segmental and somatic dysfunction of cervical region: Secondary | ICD-10-CM | POA: Diagnosis not present

## 2024-01-10 DIAGNOSIS — M9902 Segmental and somatic dysfunction of thoracic region: Secondary | ICD-10-CM | POA: Diagnosis not present

## 2024-01-10 DIAGNOSIS — M9905 Segmental and somatic dysfunction of pelvic region: Secondary | ICD-10-CM | POA: Diagnosis not present

## 2024-01-10 DIAGNOSIS — M9903 Segmental and somatic dysfunction of lumbar region: Secondary | ICD-10-CM | POA: Diagnosis not present

## 2024-01-16 DIAGNOSIS — G2581 Restless legs syndrome: Secondary | ICD-10-CM | POA: Diagnosis not present

## 2024-01-16 DIAGNOSIS — G20C Parkinsonism, unspecified: Secondary | ICD-10-CM | POA: Diagnosis not present

## 2024-01-16 DIAGNOSIS — G20A1 Parkinson's disease without dyskinesia, without mention of fluctuations: Secondary | ICD-10-CM | POA: Diagnosis not present

## 2024-01-16 DIAGNOSIS — R251 Tremor, unspecified: Secondary | ICD-10-CM | POA: Diagnosis not present

## 2024-01-18 DIAGNOSIS — M9902 Segmental and somatic dysfunction of thoracic region: Secondary | ICD-10-CM | POA: Diagnosis not present

## 2024-01-18 DIAGNOSIS — M9901 Segmental and somatic dysfunction of cervical region: Secondary | ICD-10-CM | POA: Diagnosis not present

## 2024-01-18 DIAGNOSIS — M9905 Segmental and somatic dysfunction of pelvic region: Secondary | ICD-10-CM | POA: Diagnosis not present

## 2024-01-18 DIAGNOSIS — M9903 Segmental and somatic dysfunction of lumbar region: Secondary | ICD-10-CM | POA: Diagnosis not present

## 2024-01-24 DIAGNOSIS — H26491 Other secondary cataract, right eye: Secondary | ICD-10-CM | POA: Diagnosis not present

## 2024-02-05 DIAGNOSIS — H26492 Other secondary cataract, left eye: Secondary | ICD-10-CM | POA: Diagnosis not present

## 2024-02-15 DIAGNOSIS — M9903 Segmental and somatic dysfunction of lumbar region: Secondary | ICD-10-CM | POA: Diagnosis not present

## 2024-02-15 DIAGNOSIS — M9902 Segmental and somatic dysfunction of thoracic region: Secondary | ICD-10-CM | POA: Diagnosis not present

## 2024-02-15 DIAGNOSIS — M9901 Segmental and somatic dysfunction of cervical region: Secondary | ICD-10-CM | POA: Diagnosis not present

## 2024-02-15 DIAGNOSIS — M9905 Segmental and somatic dysfunction of pelvic region: Secondary | ICD-10-CM | POA: Diagnosis not present

## 2024-02-22 DIAGNOSIS — M9902 Segmental and somatic dysfunction of thoracic region: Secondary | ICD-10-CM | POA: Diagnosis not present

## 2024-02-22 DIAGNOSIS — M9903 Segmental and somatic dysfunction of lumbar region: Secondary | ICD-10-CM | POA: Diagnosis not present

## 2024-02-22 DIAGNOSIS — M9905 Segmental and somatic dysfunction of pelvic region: Secondary | ICD-10-CM | POA: Diagnosis not present

## 2024-02-22 DIAGNOSIS — M9901 Segmental and somatic dysfunction of cervical region: Secondary | ICD-10-CM | POA: Diagnosis not present

## 2024-03-13 ENCOUNTER — Other Ambulatory Visit: Payer: Self-pay | Admitting: Nurse Practitioner

## 2024-03-13 DIAGNOSIS — I2510 Atherosclerotic heart disease of native coronary artery without angina pectoris: Secondary | ICD-10-CM

## 2024-04-01 ENCOUNTER — Ambulatory Visit

## 2024-04-12 ENCOUNTER — Telehealth (HOSPITAL_COMMUNITY): Payer: Self-pay | Admitting: *Deleted

## 2024-04-12 NOTE — Telephone Encounter (Signed)
 Attempted to call patient regarding upcoming cardiac CT appointment. Left message on voicemail with name and callback number Sid Seats RN Navigator Cardiac Imaging [facility] [phone] Office

## 2024-04-12 NOTE — Telephone Encounter (Signed)
 Reaching out to patient to offer assistance regarding upcoming cardiac imaging study; pt verbalizes understanding of appt date/time, parking situation and where to check in, pre-test NPO status and medications ordered, and verified current allergies; name and call back number provided for further questions should they arise Sid Seats RN Navigator Cardiac Imaging [facility] [phone] office [phone] cell

## 2024-04-15 ENCOUNTER — Other Ambulatory Visit

## 2024-04-15 ENCOUNTER — Ambulatory Visit
